# Patient Record
Sex: Female | Born: 1955
Health system: Southern US, Community
[De-identification: ages and names within clinical notes are randomized; demographics above are authoritative.]

## PROBLEM LIST (undated history)

## (undated) DIAGNOSIS — D72819 Decreased white blood cell count, unspecified: Secondary | ICD-10-CM

## (undated) DIAGNOSIS — T7840XA Allergy, unspecified, initial encounter: Secondary | ICD-10-CM

## (undated) DIAGNOSIS — B269 Mumps without complication: Secondary | ICD-10-CM

## (undated) DIAGNOSIS — T783XXA Angioneurotic edema, initial encounter: Secondary | ICD-10-CM

## (undated) DIAGNOSIS — B019 Varicella without complication: Secondary | ICD-10-CM

## (undated) DIAGNOSIS — B059 Measles without complication: Secondary | ICD-10-CM

## (undated) DIAGNOSIS — E785 Hyperlipidemia, unspecified: Secondary | ICD-10-CM

## (undated) DIAGNOSIS — E663 Overweight: Secondary | ICD-10-CM

## (undated) DIAGNOSIS — R040 Epistaxis: Secondary | ICD-10-CM

## (undated) DIAGNOSIS — I10 Essential (primary) hypertension: Secondary | ICD-10-CM

## (undated) HISTORY — DX: Varicella without complication: B01.9

## (undated) HISTORY — DX: Measles without complication: B05.9

## (undated) HISTORY — DX: Mumps without complication: B26.9

## (undated) HISTORY — DX: Angioneurotic edema, initial encounter: T78.3XXA

## (undated) HISTORY — DX: Decreased white blood cell count, unspecified: D72.819

## (undated) HISTORY — DX: Allergy, unspecified, initial encounter: T78.40XA

## (undated) HISTORY — DX: Overweight: E66.3

## (undated) HISTORY — DX: Hyperlipidemia, unspecified: E78.5

## (undated) HISTORY — DX: Epistaxis: R04.0

## (undated) HISTORY — DX: Essential (primary) hypertension: I10

---

## 1998-12-27 ENCOUNTER — Other Ambulatory Visit: Admission: RE | Admit: 1998-12-27 | Discharge: 1998-12-27 | Payer: Self-pay | Admitting: Obstetrics & Gynecology

## 2000-02-07 ENCOUNTER — Other Ambulatory Visit: Admission: RE | Admit: 2000-02-07 | Discharge: 2000-02-07 | Payer: Self-pay | Admitting: Obstetrics & Gynecology

## 2001-05-15 ENCOUNTER — Other Ambulatory Visit: Admission: RE | Admit: 2001-05-15 | Discharge: 2001-05-15 | Payer: Self-pay | Admitting: Obstetrics & Gynecology

## 2002-05-29 ENCOUNTER — Other Ambulatory Visit: Admission: RE | Admit: 2002-05-29 | Discharge: 2002-05-29 | Payer: Self-pay | Admitting: Obstetrics & Gynecology

## 2003-06-03 ENCOUNTER — Other Ambulatory Visit: Admission: RE | Admit: 2003-06-03 | Discharge: 2003-06-03 | Payer: Self-pay | Admitting: Obstetrics & Gynecology

## 2004-06-30 ENCOUNTER — Other Ambulatory Visit: Admission: RE | Admit: 2004-06-30 | Discharge: 2004-06-30 | Payer: Self-pay | Admitting: Obstetrics & Gynecology

## 2005-05-09 ENCOUNTER — Ambulatory Visit: Payer: Self-pay | Admitting: Internal Medicine

## 2005-05-18 ENCOUNTER — Ambulatory Visit: Payer: Self-pay | Admitting: Internal Medicine

## 2005-07-31 ENCOUNTER — Other Ambulatory Visit: Admission: RE | Admit: 2005-07-31 | Discharge: 2005-07-31 | Payer: Self-pay | Admitting: Obstetrics and Gynecology

## 2005-11-21 ENCOUNTER — Ambulatory Visit: Payer: Self-pay | Admitting: Internal Medicine

## 2006-07-31 ENCOUNTER — Ambulatory Visit: Payer: Self-pay | Admitting: Internal Medicine

## 2006-07-31 LAB — CONVERTED CEMR LAB
Alkaline Phosphatase: 48 units/L (ref 39–117)
BUN: 8 mg/dL (ref 6–23)
Basophils Relative: 0.8 % (ref 0.0–1.0)
Bilirubin, Direct: 0.2 mg/dL (ref 0.0–0.3)
CO2: 26 meq/L (ref 19–32)
Cholesterol: 179 mg/dL (ref 0–200)
Eosinophils Absolute: 0.2 10*3/uL (ref 0.0–0.6)
GFR calc Af Amer: 76 mL/min
GFR calc non Af Amer: 63 mL/min
HDL: 60.7 mg/dL (ref >39.0)
Hemoglobin: 14.4 g/dL (ref 12.0–15.0)
Lymphocytes Relative: 30.5 % (ref 12.0–46.0)
MCHC: 34.4 g/dL (ref 30.0–36.0)
MCV: 85.9 fL (ref 78.0–100.0)
Monocytes Absolute: 0.4 10*3/uL (ref 0.2–0.7)
Monocytes Relative: 7.3 % (ref 3.0–11.0)
Neutro Abs: 3.4 10*3/uL (ref 1.4–7.7)
Platelets: 306 10*3/uL (ref 150–400)
Potassium: 4.2 meq/L (ref 3.5–5.1)
Total Protein: 7 g/dL (ref 6.0–8.3)
Triglycerides: 66 mg/dL (ref 0–149)
VLDL: 13 mg/dL (ref 0–40)

## 2006-08-21 ENCOUNTER — Ambulatory Visit: Payer: Self-pay | Admitting: Internal Medicine

## 2006-09-10 ENCOUNTER — Encounter: Payer: Self-pay | Admitting: Internal Medicine

## 2006-09-10 LAB — CONVERTED CEMR LAB

## 2006-12-24 ENCOUNTER — Ambulatory Visit: Payer: Self-pay | Admitting: Internal Medicine

## 2007-01-22 ENCOUNTER — Ambulatory Visit: Payer: Self-pay | Admitting: Internal Medicine

## 2007-03-05 ENCOUNTER — Ambulatory Visit: Payer: Self-pay | Admitting: Internal Medicine

## 2007-04-17 ENCOUNTER — Encounter: Payer: Self-pay | Admitting: Internal Medicine

## 2007-04-17 ENCOUNTER — Ambulatory Visit: Payer: Self-pay

## 2007-04-18 ENCOUNTER — Ambulatory Visit: Payer: Self-pay | Admitting: Internal Medicine

## 2007-04-30 ENCOUNTER — Encounter: Payer: Self-pay | Admitting: Internal Medicine

## 2007-04-30 DIAGNOSIS — I10 Essential (primary) hypertension: Secondary | ICD-10-CM | POA: Insufficient documentation

## 2007-04-30 DIAGNOSIS — T783XXA Angioneurotic edema, initial encounter: Secondary | ICD-10-CM | POA: Insufficient documentation

## 2007-05-03 ENCOUNTER — Encounter: Payer: Self-pay | Admitting: Internal Medicine

## 2007-08-15 ENCOUNTER — Ambulatory Visit: Payer: Self-pay | Admitting: Internal Medicine

## 2007-08-15 DIAGNOSIS — E663 Overweight: Secondary | ICD-10-CM

## 2007-08-15 HISTORY — DX: Overweight: E66.3

## 2007-10-21 ENCOUNTER — Encounter: Payer: Self-pay | Admitting: Internal Medicine

## 2007-10-22 ENCOUNTER — Telehealth: Payer: Self-pay | Admitting: Internal Medicine

## 2007-11-27 ENCOUNTER — Telehealth: Payer: Self-pay | Admitting: Internal Medicine

## 2007-12-03 ENCOUNTER — Encounter: Payer: Self-pay | Admitting: Internal Medicine

## 2007-12-16 LAB — CONVERTED CEMR LAB: Pap Smear: NORMAL

## 2008-02-21 ENCOUNTER — Encounter: Payer: Self-pay | Admitting: Internal Medicine

## 2008-04-24 ENCOUNTER — Telehealth: Payer: Self-pay | Admitting: Internal Medicine

## 2008-04-28 ENCOUNTER — Ambulatory Visit: Payer: Self-pay | Admitting: Internal Medicine

## 2008-05-05 ENCOUNTER — Ambulatory Visit: Payer: Self-pay | Admitting: Internal Medicine

## 2008-05-05 LAB — CONVERTED CEMR LAB
BUN: 9 mg/dL (ref 6–23)
Chloride: 109 meq/L (ref 96–112)
Cholesterol: 178 mg/dL (ref 0–200)
Glucose, Bld: 102 mg/dL — ABNORMAL HIGH (ref 70–99)
Potassium: 3.8 meq/L (ref 3.5–5.1)
TSH: 3.26 microintl units/mL (ref 0.35–5.50)
Triglycerides: 62 mg/dL (ref 0–149)
VLDL: 12 mg/dL (ref 0–40)

## 2008-07-21 ENCOUNTER — Telehealth: Payer: Self-pay | Admitting: Internal Medicine

## 2008-10-27 ENCOUNTER — Ambulatory Visit: Payer: Self-pay | Admitting: Internal Medicine

## 2009-02-02 LAB — CONVERTED CEMR LAB: Pap Smear: NORMAL

## 2009-02-02 LAB — HM MAMMOGRAPHY: HM Mammogram: NORMAL

## 2009-03-08 ENCOUNTER — Telehealth: Payer: Self-pay | Admitting: Internal Medicine

## 2009-04-19 ENCOUNTER — Ambulatory Visit: Payer: Self-pay | Admitting: Internal Medicine

## 2009-05-06 ENCOUNTER — Ambulatory Visit: Payer: Self-pay | Admitting: Internal Medicine

## 2009-05-06 LAB — HM COLONOSCOPY

## 2009-05-08 ENCOUNTER — Encounter: Payer: Self-pay | Admitting: Internal Medicine

## 2009-08-24 ENCOUNTER — Ambulatory Visit: Payer: Self-pay | Admitting: Internal Medicine

## 2009-08-24 DIAGNOSIS — R000 Tachycardia, unspecified: Secondary | ICD-10-CM | POA: Insufficient documentation

## 2009-08-27 ENCOUNTER — Ambulatory Visit: Payer: Self-pay | Admitting: Internal Medicine

## 2009-08-27 LAB — CONVERTED CEMR LAB
ALT: 14 units/L (ref 0–35)
Albumin: 3.6 g/dL (ref 3.5–5.2)
Basophils Relative: 4.5 % — ABNORMAL HIGH (ref 0.0–3.0)
Bilirubin, Direct: 0.2 mg/dL (ref 0.0–0.3)
Cholesterol: 165 mg/dL (ref 0–200)
Eosinophils Absolute: 0.1 10*3/uL (ref 0.0–0.7)
GFR calc non Af Amer: 96.71 mL/min (ref >60)
HDL: 63.4 mg/dL (ref >39.00)
Lymphocytes Relative: 37.5 % (ref 12.0–46.0)
MCHC: 32.9 g/dL (ref 30.0–36.0)
Neutrophils Relative %: 45.2 % (ref 43.0–77.0)
Potassium: 3.8 meq/L (ref 3.5–5.1)
RBC: 4.61 M/uL (ref 3.87–5.11)
Sodium: 141 meq/L (ref 135–145)
Total Protein: 7.3 g/dL (ref 6.0–8.3)
Triglycerides: 51 mg/dL (ref 0.0–149.0)
VLDL: 10.2 mg/dL (ref 0.0–40.0)
WBC: 3.9 10*3/uL — ABNORMAL LOW (ref 4.5–10.5)

## 2009-11-19 ENCOUNTER — Ambulatory Visit: Payer: Self-pay | Admitting: Internal Medicine

## 2009-12-09 ENCOUNTER — Telehealth: Payer: Self-pay | Admitting: Internal Medicine

## 2009-12-16 ENCOUNTER — Ambulatory Visit: Payer: Self-pay | Admitting: Internal Medicine

## 2010-06-02 LAB — HM MAMMOGRAPHY: HM Mammogram: NORMAL

## 2010-07-21 NOTE — Progress Notes (Signed)
Summary: Amlodipine Refill  Phone Note Refill Request Message from:  Fax from Pharmacy on December 09, 2009 8:58 AM  Refills Requested: Medication #1:  AMLODIPINE BESYLATE 10 MG  TABS one by mouth once daily.   Dosage confirmed as above?Dosage Confirmed   Brand Name Necessary? No   Supply Requested: 3 months   Last Refilled: 09/01/2009  Method Requested: Electronic Next Appointment Scheduled: None Initial call taken by: Glendell Docker CMA,  December 09, 2009 8:58 AM    Prescriptions: AMLODIPINE BESYLATE 10 MG  TABS (AMLODIPINE BESYLATE) one by mouth once daily  #90 x 0   Entered by:   Glendell Docker CMA   Authorized by:   D. Thomos Lemons DO   Signed by:   Glendell Docker CMA on 12/09/2009   Method used:   [facility]        [facility] [address] [phone]* (retail)       [address]       [place], [place]  [address]       Ph: [phone] or [phone]       Fax: [phone]   RxID:   [id number]

## 2010-07-21 NOTE — Letter (Signed)
   [place] [facility] [address] [place], [place]  [address]  [place] Phone: [phone]      August 27, 2009   [facility] [place] [address] [place], [place] [address]  RE:  LAB RESULTS  Dear  Ms. Morton,  The following is an interpretation of your most recent lab tests.  Please take note of any instructions provided or changes to medications that have resulted from your lab work.  ELECTROLYTES:  Good - no changes needed  KIDNEY FUNCTION TESTS:  Good - no changes needed  LIVER FUNCTION TESTS:  Good - no changes needed  LIPID PANEL:  Good - no changes needed Triglyceride: 51.0   Cholesterol: 165   LDL: 91   HDL: 63.40   Chol/HDL%:  3  THYROID STUDIES:  Thyroid studies normal TSH: 3.06     CBC:  Good - no changes needed       Sincerely Yours,    Dr. Thomos Lemons

## 2010-07-21 NOTE — Assessment & Plan Note (Signed)
Summary: f/u on meds - jr   Vital Signs:  Patient profile:   55 year old female Height:      68 inches Weight:      199 pounds BMI:     30.37 O2 Sat:      100 % on Room air Temp:     97.8 degrees F oral Pulse rate:   100 / minute Pulse rhythm:   regular Resp:     16 per minute BP sitting:   130 / 80  (right arm) Cuff size:   large  Vitals Entered By: Glendell Docker CMA (August 24, 2009 3:29 PM)  O2 Flow:  Room air CC: Rm 2- Follow up  Comments Medication refills   Primary Care Provider:  Dondra Spry DO  CC:  Rm 2- Follow up .  History of Present Illness:  Hypertension Follow-Up      This is a 55 year old woman who presents for Hypertension follow-up.  The patient reports urinary frequency, but denies lightheadedness.  The patient denies the following associated symptoms: chest pain.  Compliance with medications (by patient report) has been near 100%.  The patient reports that dietary compliance has been fair.  The patient reports no exercise.    Allergies (verified): No Known Drug Allergies  Past History:  Past Medical History: Hypertension Family history of diabetes  ACE - angioedema    Family History: Family History Diabetes 1st degree relative - mother age 21 Father deceased at age 85 - pancreatic cancer Multiple siblings with hypertension    Social History: Occupation:  Marine scientist No children Married Never Smoked  Alcohol use-no   Review of Systems       The patient complains of weight gain.    Physical Exam  General:  alert, well-developed, and well-nourished.   Neck:  supple and no carotid bruits.   Lungs:  normal respiratory effort and normal breath sounds.   Heart:  normal rate, regular rhythm, and no gallop.   Extremities:  No lower extremity edema    Impression & Recommendations:  Problem # 1:  HYPERTENSION (ICD-401.9) Pt ran out of HCTZ.   she complains freq urination.  HR is high.  Increase Coreg CR to 40 mg.  DC  Hctz The following medications were removed from the medication list:    Hydrochlorothiazide 25 Mg Tabs (Hydrochlorothiazide) .Marland Kitchen... Take 1 tablet by mouth once a day (needs ov for more refills) Her updated medication list for this problem includes:    Coreg Cr 40 Mg Xr24h-cap (Carvedilol phosphate) ..... One by mouth once daily    Amlodipine Besylate 10 Mg Tabs (Amlodipine besylate) ..... One by mouth once daily  Complete Medication List: 1)  Microgestin 1/20 1-20 Mg-mcg Tabs (Norethindrone acet-ethinyl est) 2)  Coreg Cr 40 Mg Xr24h-cap (Carvedilol phosphate) .... One by mouth once daily 3)  Amlodipine Besylate 10 Mg Tabs (Amlodipine besylate) .... One by mouth once daily  Patient Instructions: 1)  Please schedule a follow-up appointment in 2 months Prescriptions: COREG CR 40 MG XR24H-CAP (CARVEDILOL PHOSPHATE) one by mouth once daily  #30 x 2   Entered and Authorized by:   D. Thomos Lemons DO   Signed by:   D. Thomos Lemons DO on 08/24/2009   Method used:   [facility]        [facility] [address] [phone]* (retail)       [address]       [place]  [place], [place]  [address]       Ph: [phone] or [phone]       Fax: [phone]   RxID:   [id number]   Current Allergies (reviewed today): No known allergies    Immunization History:  Tetanus/Td Immunization History:    Tetanus/Td:  declined (08/24/2009)  Influenza Immunization History:    Influenza:  declined (08/24/2009)    Preventive Care Screening  Last Tetanus Booster:    Date:  08/24/2009    Results:  Declined  Mammogram:    Date:  02/02/2009    Results:  normal   Pap Smear:    Date:  02/02/2009    Results:  normal

## 2010-07-21 NOTE — Assessment & Plan Note (Signed)
Summary: 3 month follow up/mhf   Vital Signs:  Patient profile:   55 year old female Height:      68 inches Weight:      195 pounds BMI:     29.76 O2 Sat:      98 % on Room air Temp:     98.7 degrees F oral Pulse rate:   96 / minute Pulse rhythm:   regular Resp:     18 per minute BP sitting:   120 / 70  (right arm) Cuff size:   large  Vitals Entered By: Glendell Docker CMA (December 16, 2009 3:33 PM)  O2 Flow:  Room air CC: Rm 2- 3 month follow up Is Patient Diabetic? No Comments medications reviewed, no concerns, refill on Coreg   Primary Care Provider:  DThomos Lemons DO  CC:  Rm 2- 3 month follow up.  History of Present Illness:  Hypertension Follow-Up      This is a 55 year old woman who presents for Hypertension follow-up.  The patient denies headaches and edema.  The patient denies the following associated symptoms: chest pain.  Compliance with medications (by patient report) has been near 100%.  The patient reports that dietary compliance has been fair.    Preventive Screening-Counseling & Management  Alcohol-Tobacco     Smoking Status: never  Allergies (verified): No Known Drug Allergies  Past History:  Past Medical History: Hypertension Family history of diabetes   ACE - angioedema    Family History: Family History Diabetes 1st degree relative - mother age 35 mother passed away in 10/12/22 Father deceased at age 29 - pancreatic cancer Multiple siblings with hypertension    Physical Exam  General:  alert, well-developed, and well-nourished.   Neck:  supple and no carotid bruits.   Lungs:  normal respiratory effort and normal breath sounds.   Heart:  normal rate, regular rhythm, and no gallop.   Extremities:  No lower extremity edema    Impression & Recommendations:  Problem # 1:  HYPERTENSION (ICD-401.9) Assessment Improved well controlled.  Maintain current medication regimen.  Her updated medication list for this problem includes:    Coreg Cr  40 Mg Xr24h-cap (Carvedilol phosphate) .Marland Kitchen... Take 1 tablet by mouth once a day    Amlodipine Besylate 10 Mg Tabs (Amlodipine besylate) ..... One by mouth once daily  BP today: 120/70 Prior BP: 130/80 (08/24/2009)  Labs Reviewed: K+: 3.8 (08/27/2009) Creat: : 0.8 (08/27/2009)   Chol: 165 (08/27/2009)   HDL: 63.40 (08/27/2009)   LDL: 91 (08/27/2009)   TG: 51.0 (08/27/2009)  Complete Medication List: 1)  Microgestin 1/20 1-20 Mg-mcg Tabs (Norethindrone acet-ethinyl est) 2)  Coreg Cr 40 Mg Xr24h-cap (Carvedilol phosphate) .... Take 1 tablet by mouth once a day 3)  Amlodipine Besylate 10 Mg Tabs (Amlodipine besylate) .... One by mouth once daily  Patient Instructions: 1)  Please schedule a follow-up appointment in 1 year for CPX 2)  BMP prior to visit, ICD-9:  401.9 3)  TSH prior to visit, ICD-9:  401.9 4)  Please return for lab work one (1) week before your next appointment.  Prescriptions: AMLODIPINE BESYLATE 10 MG  TABS (AMLODIPINE BESYLATE) one by mouth once daily  #90 x 3   Entered and Authorized by:   D. Thomos Lemons DO   Signed by:   D. Thomos Lemons DO on 12/16/2009   Method used:   [facility]        [facility] [address] #  [id number]* (retail)       [address]       [place], [place]  [address]       Ph: [phone] or [phone]       Fax: [phone]   RxID:   [id number] COREG CR 40 MG XR24H-CAP (CARVEDILOL PHOSPHATE) Take 1 tablet by mouth once a day  #90 x 3   Entered and Authorized by:   D. Thomos Lemons DO   Signed by:   D. Thomos Lemons DO on 12/16/2009   Method used:   [facility]        [facility] [address] [phone]* (retail)       [address]       [place], [place]  [address]       Ph: [phone] or [phone]       Fax: [phone]   RxID:   [id number]   Current Allergies (reviewed today): No known allergies

## 2010-11-04 NOTE — Assessment & Plan Note (Signed)
[facility]                           PRIMARY CARE OFFICE NOTE   NAME:Grassi, Amber [name]                     MRN:          [id number]  DATE:10/22/2006                            DOB:          23-Aug-1955    CHIEF COMPLAINT:  New patient to practice, transferring from [facility], Dr. Fabian [name].   HISTORY OF PRESENT ILLNESS:  Patient is a 55 year old African American  female here to establish continuity of care.  She was formerly followed  by Dr. Fabian [name] for hypertension.  She states she has been treated for the  last 10 to 15 years.  She was on a combination of an ACE inhibitor and  hydrochlorothiazide.  Over several years, she had issues with  intermittent swelling of her lips.  Recently, Accupril was changed to  quinapril, which worsened possible angioedema.  She was instructed to  completely discontinue all ACE inhibitor, and recently started on  amlodipine 5 mg over the last 3 weeks.  She has tolerated this well,  however, her blood pressures have been somewhat labile.  Her blood  pressure today in the office is in the 150s to 160s systolic.  She  reports blood pressure readings in the 120s to 130s when she was seen by  her gynecologist.   CURRENT MEDICATIONS:  1. Hydrochlorothiazide once a day.  2. Amlodipine 5 mg once a day.  3. Microgestin flatulence control pill 1/20 as directed.   ALLERGIES:  ACE INHIBITORS WHICH CAUSE ANGIOEDEMA.   SOCIAL HISTORY:  Patient has been married for the last 27 years.  She  does not have any children.  She works as an Marine scientist.   FAMILY HISTORY:  Mother is age 14, has type 2 diabetes, and is on  insulin.  Father deceased at age 63 of complications of pancreatic  cancer.  Patient has multiple siblings, all of whom have issues with  hypertension.   HABITS:  No alcohol.  No tobacco.   REVIEW OF SYSTEMS:  Denies any history of coronary artery disease,  stroke.  Her blood pressures have been  normal in the past.  She denies  any chest pain or dyspnea.  She does not exercise on a regular basis.  Denies heartburn, nausea, vomiting, constipation, or diarrhea.  She is  reported to have sickle cell trait.   PHYSICAL EXAMINATION:  VITAL SIGNS:  Height is 5 feet 8 inches.  Weight  is 180 pounds.  Temperature is 97.7.  [name] is 118.  Blood pressure is  161/95 on the left in a seated position.  GENERAL:  The patient is a pleasant, somewhat overweight 55 year old  African American female in no apparent distress.  HEENT:  Normocephalic and atraumatic.  Pupils are equal and reactive to  light bilaterally.  Extraocular mobility was intact.  Patient was  anicteric.  Conjunctivae was within normal limits.  External auditory canals and tympanic membranes are clear bilaterally.  Hearing was grossly normal.  Oropharyngeal exam was unremarkable.  NECK:  Supple without any evidence of adenopathy, carotid bruits, or  thyromegaly.  There was no acanthosis nigricans noted.  CHEST  EXAM:  Normal respiratory effort.  Chest was clear to auscultation  bilaterally.  No rhonchi, rales, or wheezing.  CARDIOVASCULAR:  Regular rate and rhythm.  No significant murmurs, rubs,  or gallops appreciated.  ABDOMEN:  Soft and nontender.  Positive bowel sounds.  No organomegaly.  MUSCULOSKELETAL EXAM:  No clubbing, cyanosis, or edema.  Patient had intact dorsalis pedis pulses.  NEUROLOGIC:  Cranial nerves 2 through 12 grossly intact.  She was  nonfocal.   ASSESSMENT:  1. Hypertension.  Suboptimally controlled.  2. History of angioedema secondary to ACE inhibitor.  3. Family history of type 2 diabetes with mild obesity.  4. Health maintenance.   RECOMMENDATIONS:  1. We will add carvedilol 3.125 mg p.o. b.i.d. to her current regimen.      She is to maintain blood pressure log until followup visit.  2. We reviewed her previous labs from February of 2008, and she has a      normal creatinine of 1, normal blood  sugar of 82.  I encouraged      further wt loss.  3. She has a gynecologist, and she obtains periodic Pap, pelvic, and      mammogram.  She is to continue the same.  We discussed the need for      screening colonoscopy once she turns 51.  4. Follow up in 6 weeks.     Barbette Hair. Artist Pais, DO  Electronically Signed    RDY/MedQ  DD: 01/22/2007  DT: 01/23/2007  Job #: [id number]

## 2010-12-13 ENCOUNTER — Ambulatory Visit: Payer: Self-pay | Admitting: Internal Medicine

## 2010-12-23 ENCOUNTER — Ambulatory Visit: Payer: Self-pay | Admitting: Internal Medicine

## 2010-12-28 ENCOUNTER — Telehealth: Payer: Self-pay | Admitting: Internal Medicine

## 2010-12-28 MED ORDER — CARVEDILOL PHOSPHATE ER 40 MG PO CP24: 40.0000 mg | ORAL_CAPSULE | Freq: Every day | ORAL | Status: DC

## 2010-12-28 NOTE — Telephone Encounter (Signed)
Addended by: Mervin Kung A on: 12/28/2010 02:51 PM   Modules accepted: Orders

## 2010-12-28 NOTE — Telephone Encounter (Signed)
Refill- coreg cr 40mg  capsule. Take one capsule every day. Qty 90. Last fill 4.16.12

## 2010-12-28 NOTE — Telephone Encounter (Signed)
Previous refill went to printer. Re-sent rx electronically.

## 2011-01-11 ENCOUNTER — Encounter: Payer: Self-pay | Admitting: Internal Medicine

## 2011-01-19 ENCOUNTER — Encounter: Payer: Self-pay | Admitting: Internal Medicine

## 2011-01-19 ENCOUNTER — Ambulatory Visit (INDEPENDENT_AMBULATORY_CARE_PROVIDER_SITE_OTHER): Payer: Commercial Managed Care - PPO | Admitting: Internal Medicine

## 2011-01-19 ENCOUNTER — Ambulatory Visit: Payer: Self-pay | Admitting: Internal Medicine

## 2011-01-19 DIAGNOSIS — I10 Essential (primary) hypertension: Secondary | ICD-10-CM

## 2011-01-19 DIAGNOSIS — R635 Abnormal weight gain: Secondary | ICD-10-CM

## 2011-01-19 MED ORDER — AMLODIPINE BESYLATE 10 MG PO TABS: 10.0000 mg | ORAL_TABLET | Freq: Every day | ORAL | Status: DC

## 2011-01-19 MED ORDER — CARVEDILOL PHOSPHATE ER 40 MG PO CP24: 40.0000 mg | ORAL_CAPSULE | Freq: Every day | ORAL | Status: DC

## 2011-01-19 NOTE — Progress Notes (Signed)
  Subjective:    Patient ID: Amber [name], female    DOB: 1955-11-08, 55 y.o.   MRN: [id number]  HPI Pt presents to clinic for followup of HTN and evaluation of arm pain. Notes 41month h/o left lateral elbow pain without injury or trauma. Worse with movement. Pain just recently resolved without medication. BP reviewed as nl. Tolerates norvasc without le edema. Sees gyn for pap smears reportedly nl. Wants to lose weight without medication. No other complaints.  Reviewed pmh, medications and allergies    Review of Systems see hpi     Objective:   Physical Exam  Nursing note and vitals reviewed. Constitutional: She appears well-developed and well-nourished.  HENT:  Head: Normocephalic and atraumatic.  Right Ear: External ear normal.  Left Ear: External ear normal.  Eyes: Conjunctivae are normal.  Neck: Neck supple.  Cardiovascular: Normal rate, regular rhythm and normal heart sounds.  Exam reveals no gallop and no friction rub.   No murmur heard. Pulmonary/Chest: Effort normal and breath sounds normal. No respiratory distress. She has no wheezes. She has no rales.  Neurological: She is alert.  Skin: Skin is warm and dry.  Psychiatric: She has a normal mood and affect.          Assessment & Plan:

## 2011-01-19 NOTE — Assessment & Plan Note (Signed)
Discussed decrease in portions, calories, carbs and sugars. Recommend regular aerobic exercise at least 4+ times a week.

## 2011-01-19 NOTE — Assessment & Plan Note (Signed)
Normotensive and stable. Continue current regimen. Attempt regular aerobic exercise and wt loss

## 2011-02-27 ENCOUNTER — Telehealth: Payer: Self-pay | Admitting: Internal Medicine

## 2011-02-27 MED ORDER — AMLODIPINE BESYLATE 10 MG PO TABS: 10.0000 mg | ORAL_TABLET | Freq: Every day | ORAL | Status: DC

## 2011-02-27 NOTE — Telephone Encounter (Signed)
Refill amlodipine besylate 10 mg tab qty 90 take 1 tablet every day last fill 11-28-10

## 2011-02-27 NOTE — Telephone Encounter (Signed)
Rx refill sent to pharmacy. 

## 2011-07-05 ENCOUNTER — Telehealth: Payer: Self-pay | Admitting: Internal Medicine

## 2011-07-05 NOTE — Telephone Encounter (Signed)
Patient is requesting samples of coreg

## 2011-07-06 NOTE — Telephone Encounter (Signed)
Call placed to patient at [phone], no answer. A voice message was left informing patient samples of Coreg not available. She was advised to call back if Rx was needed.

## 2011-08-04 ENCOUNTER — Ambulatory Visit: Payer: Commercial Managed Care - PPO | Admitting: Internal Medicine

## 2011-08-07 ENCOUNTER — Ambulatory Visit (INDEPENDENT_AMBULATORY_CARE_PROVIDER_SITE_OTHER): Payer: Managed Care, Other (non HMO) | Admitting: Internal Medicine

## 2011-08-07 ENCOUNTER — Encounter: Payer: Self-pay | Admitting: Internal Medicine

## 2011-08-07 DIAGNOSIS — I10 Essential (primary) hypertension: Secondary | ICD-10-CM

## 2011-08-07 MED ORDER — CARVEDILOL 6.25 MG PO TABS: 6.2500 mg | ORAL_TABLET | Freq: Two times a day (BID) | ORAL | Status: DC

## 2011-08-07 NOTE — Progress Notes (Signed)
  Subjective:    Patient ID: Amber [name], female    DOB: May 15, 1956, 56 y.o.   MRN: [id number]  HPI Pt presents to clinic for followup of multiple medical problems. BP reviewed under excellent control but notes coreg cr is expensive. Recalls recent bp of 100/77 outside of clinic. Compliant with medication without adverse effect. Denies lower extremity swelling.   Past Medical History  Diagnosis Date  . Hypertension   . Diabetes mellitus   . Angioedema    No past surgical history on file.  reports that she has never smoked. She has never used smokeless tobacco. She reports that she does not drink alcohol. Her drug history not on file. family history includes Cancer in her father; Diabetes (age of onset:78) in her mother; and Hypertension in her other. No Known Allergies    Review of Systems see hpi     Objective:   Physical Exam  Physical Exam  Nursing note and vitals reviewed. Constitutional: Appears well-developed and well-nourished. No distress.  HENT:  Head: Normocephalic and atraumatic.  Right Ear: External ear normal.  Left Ear: External ear normal.  Eyes: Conjunctivae are normal. No scleral icterus.  Neck: Neck supple. Carotid bruit is not present.  Cardiovascular: Normal rate, regular rhythm and normal heart sounds.  Exam reveals no gallop and no friction rub.   No murmur heard. Pulmonary/Chest: Effort normal and breath sounds normal. No respiratory distress. He has no wheezes. no rales.  Lymphadenopathy:    He has no cervical adenopathy.  Neurological:Alert.  Skin: Skin is warm and dry. Not diaphoretic.  Psychiatric: Has a normal mood and affect.        Assessment & Plan:

## 2011-08-07 NOTE — Assessment & Plan Note (Signed)
Convert coreg cr to coreg and lower dose slightly due to tight control. Monitor bp as outpt and f/u in clinic as scheduled. Labs with next visit

## 2011-09-11 ENCOUNTER — Telehealth: Payer: Self-pay | Admitting: Internal Medicine

## 2011-09-11 NOTE — Telephone Encounter (Signed)
Call placed to patient at [phone], no answer. A voice message was,left informing patient [facility] pharmacy was not on file. [facility]. She was advised to call back with the location of the pharmacy that she would like medication sent to.

## 2011-09-11 NOTE — Telephone Encounter (Signed)
Patient was told by pharmacy that she had to call for refill on her amlodipine 10mg   , pharmacy  [facility]

## 2011-09-12 MED ORDER — AMLODIPINE BESYLATE 10 MG PO TABS: 10.0000 mg | ORAL_TABLET | Freq: Every day | ORAL | Status: DC

## 2011-09-12 NOTE — Telephone Encounter (Signed)
Patient returned phone call and left voice message requesting refill to [facility] [address]. Rx refill sent to pharmacy.

## 2011-10-05 ENCOUNTER — Encounter: Payer: Self-pay | Admitting: Internal Medicine

## 2011-10-05 ENCOUNTER — Ambulatory Visit (INDEPENDENT_AMBULATORY_CARE_PROVIDER_SITE_OTHER): Payer: Managed Care, Other (non HMO) | Admitting: Internal Medicine

## 2011-10-05 ENCOUNTER — Telehealth: Payer: Self-pay | Admitting: *Deleted

## 2011-10-05 VITALS — BP 116/70 | HR 88 | Temp 97.9°F | Resp 16 | Ht 68.0 in | Wt 201.0 lb

## 2011-10-05 DIAGNOSIS — I10 Essential (primary) hypertension: Secondary | ICD-10-CM

## 2011-10-05 DIAGNOSIS — Z79899 Other long term (current) drug therapy: Secondary | ICD-10-CM

## 2011-10-05 NOTE — Patient Instructions (Signed)
Please schedule fasting labs prior to next visit Cbc, lipid-401.9, chem7-v58.69

## 2011-10-06 NOTE — Telephone Encounter (Signed)
Future lab orders entered for Amber [name] around 03/27/12.

## 2011-10-09 NOTE — Assessment & Plan Note (Signed)
Normotensive and stable. Continue current regimen. Monitor bp as outpt and followup in clinic as scheduled. Continue norvasc and coreg. Obtain cbc, chem7 and lipid prior to next visit.

## 2011-10-09 NOTE — Progress Notes (Signed)
  Subjective:    Patient ID: Amber [name], female    DOB: 1956-01-05, 56 y.o.   MRN: [id number]  HPI Pt presents to clinic for f/u of HTN. Last visit coreg cr changed to coreg due to cost concerns. Tolerating coreg without adverse effect. outpt bp's reviewed and nl. No leg swelling with norvasc. utd with gyn care. No active complaints.  Past Medical History  Diagnosis Date  . Hypertension   . Diabetes mellitus   . Angioedema    No past surgical history on file.  reports that she has never smoked. She has never used smokeless tobacco. She reports that she does not drink alcohol. Her drug history not on file. family history includes Cancer in her father; Diabetes (age of onset:78) in her mother; and Hypertension in her other. No Known Allergies   Review of Systems see hpi     Objective:   Physical Exam  Physical Exam  Nursing note and vitals reviewed. Constitutional: Appears well-developed and well-nourished. No distress.  HENT:  Head: Normocephalic and atraumatic.  Right Ear: External ear normal.  Left Ear: External ear normal.  Eyes: Conjunctivae are normal. No scleral icterus.  Neck: Neck supple. Carotid bruit is not present.  Cardiovascular: Normal rate, regular rhythm and normal heart sounds.  Exam reveals no gallop and no friction rub.   No murmur heard. Pulmonary/Chest: Effort normal and breath sounds normal. No respiratory distress. He has no wheezes. no rales.  Lymphadenopathy:    He has no cervical adenopathy.  Neurological:Alert.  Skin: Skin is warm and dry. Not diaphoretic.  Psychiatric: Has a normal mood and affect.        Assessment & Plan:

## 2011-11-18 LAB — HM MAMMOGRAPHY: HM MAMMO: NORMAL

## 2012-02-02 ENCOUNTER — Encounter: Payer: Self-pay | Admitting: Internal Medicine

## 2012-03-08 ENCOUNTER — Other Ambulatory Visit: Payer: Self-pay | Admitting: Internal Medicine

## 2012-03-11 NOTE — Telephone Encounter (Signed)
Done/SLS 

## 2012-03-25 ENCOUNTER — Encounter: Payer: Self-pay | Admitting: Internal Medicine

## 2012-04-05 ENCOUNTER — Ambulatory Visit: Payer: Managed Care, Other (non HMO) | Admitting: Internal Medicine

## 2012-07-05 ENCOUNTER — Other Ambulatory Visit: Payer: Self-pay | Admitting: Internal Medicine

## 2012-07-08 NOTE — Telephone Encounter (Signed)
30-day refills given-*PATIENT NEEDS OFFICE VISIT PRIOR TO FUTURE REFILLS*/SLS

## 2012-08-05 ENCOUNTER — Other Ambulatory Visit: Payer: Self-pay | Admitting: Family Medicine

## 2012-08-22 ENCOUNTER — Ambulatory Visit (INDEPENDENT_AMBULATORY_CARE_PROVIDER_SITE_OTHER): Payer: Managed Care, Other (non HMO) | Admitting: Family Medicine

## 2012-08-22 ENCOUNTER — Other Ambulatory Visit: Payer: Self-pay | Admitting: Family Medicine

## 2012-08-22 ENCOUNTER — Encounter: Payer: Self-pay | Admitting: Family Medicine

## 2012-08-22 VITALS — BP 138/80 | HR 85 | Temp 98.5°F | Ht 68.0 in | Wt 187.0 lb

## 2012-08-22 DIAGNOSIS — Z0000 Encounter for general adult medical examination without abnormal findings: Secondary | ICD-10-CM

## 2012-08-22 DIAGNOSIS — I10 Essential (primary) hypertension: Secondary | ICD-10-CM

## 2012-08-22 DIAGNOSIS — R000 Tachycardia, unspecified: Secondary | ICD-10-CM

## 2012-08-22 DIAGNOSIS — R635 Abnormal weight gain: Secondary | ICD-10-CM

## 2012-08-22 LAB — CBC
HCT: 40.5 % (ref 36.0–46.0)
Hemoglobin: 14.1 g/dL (ref 12.0–15.0)
MCH: 28.3 pg (ref 26.0–34.0)
MCHC: 34.8 g/dL (ref 30.0–36.0)
MCV: 81.3 fL (ref 78.0–100.0)

## 2012-08-22 MED ORDER — AMLODIPINE BESYLATE 10 MG PO TABS: 10.0000 mg | ORAL_TABLET | Freq: Every day | ORAL | Status: DC

## 2012-08-22 MED ORDER — CARVEDILOL 6.25 MG PO TABS: 6.2500 mg | ORAL_TABLET | Freq: Two times a day (BID) | ORAL | Status: DC

## 2012-08-22 NOTE — Patient Instructions (Addendum)
Labs prior lipid, renal, cbc, tsh, hepatic  Start Krill oil/MegaRed caps daily by Schiff Metamucil daily And Probiotics Digestive Advantage caps daily  Preventive Care for Adults, Female A healthy lifestyle and preventive care can promote health and wellness. Preventive health guidelines for women include the following key practices.  A routine yearly physical is a good way to check with your caregiver about your health and preventive screening. It is a chance to share any concerns and updates on your health, and to receive a thorough exam.  Visit your dentist for a routine exam and preventive care every 6 months. Brush your teeth twice a day and floss once a day. Good oral hygiene prevents tooth decay and gum disease.  The frequency of eye exams is based on your age, health, family medical history, use of contact lenses, and other factors. Follow your caregiver's recommendations for frequency of eye exams.  Eat a healthy diet. Foods like vegetables, fruits, whole grains, low-fat dairy products, and lean protein foods contain the nutrients you need without too many calories. Decrease your intake of foods high in solid fats, added sugars, and salt. Eat the right amount of calories for you.Get information about a proper diet from your caregiver, if necessary.  Regular physical exercise is one of the most important things you can do for your health. Most adults should get at least 150 minutes of moderate-intensity exercise (any activity that increases your heart rate and causes you to sweat) each week. In addition, most adults need muscle-strengthening exercises on 2 or more days a week.  Maintain a healthy weight. The body mass index (BMI) is a screening tool to identify possible weight problems. It provides an estimate of body fat based on height and weight. Your caregiver can help determine your BMI, and can help you achieve or maintain a healthy weight.For adults 20 years and older:  A BMI  below 18.5 is considered underweight.  A BMI of 18.5 to 24.9 is normal.  A BMI of 25 to 29.9 is considered overweight.  A BMI of 30 and above is considered obese.  Maintain normal blood lipids and cholesterol levels by exercising and minimizing your intake of saturated fat. Eat a balanced diet with plenty of fruit and vegetables. Blood tests for lipids and cholesterol should begin at age 61 and be repeated every 5 years. If your lipid or cholesterol levels are high, you are over 50, or you are at high risk for heart disease, you may need your cholesterol levels checked more frequently.Ongoing high lipid and cholesterol levels should be treated with medicines if diet and exercise are not effective.  If you smoke, find out from your caregiver how to quit. If you do not use tobacco, do not start.  If you are pregnant, do not drink alcohol. If you are breastfeeding, be very cautious about drinking alcohol. If you are not pregnant and choose to drink alcohol, do not exceed 1 drink per day. One drink is considered to be 12 ounces (355 mL) of beer, 5 ounces (148 mL) of wine, or 1.5 ounces (44 mL) of liquor.  Avoid use of street drugs. Do not share needles with anyone. Ask for help if you need support or instructions about stopping the use of drugs.  High blood pressure causes heart disease and increases the risk of stroke. Your blood pressure should be checked at least every 1 to 2 years. Ongoing high blood pressure should be treated with medicines if weight loss and exercise are  not effective.  If you are 20 to 56 years old, ask your caregiver if you should take aspirin to prevent strokes.  Diabetes screening involves taking a blood sample to check your fasting blood sugar level. This should be done once every 3 years, after age 91, if you are within normal weight and without risk factors for diabetes. Testing should be considered at a younger age or be carried out more frequently if you are  overweight and have at least 1 risk factor for diabetes.  Breast cancer screening is essential preventive care for women. You should practice "breast self-awareness." This means understanding the normal appearance and feel of your breasts and may include breast self-examination. Any changes detected, no matter how small, should be reported to a caregiver. Women in their 75s and 30s should have a clinical breast exam (CBE) by a caregiver as part of a regular health exam every 1 to 3 years. After age 49, women should have a CBE every year. Starting at age 7, women should consider having a mammography (breast X-ray test) every year. Women who have a family history of breast cancer should talk to their caregiver about genetic screening. Women at a high risk of breast cancer should talk to their caregivers about having magnetic resonance imaging (MRI) and a mammography every year.  The Pap test is a screening test for cervical cancer. A Pap test can show cell changes on the cervix that might become cervical cancer if left untreated. A Pap test is a procedure in which cells are obtained and examined from the lower end of the uterus (cervix).  Women should have a Pap test starting at age 65.  Between ages 55 and 57, Pap tests should be repeated every 2 years.  Beginning at age 26, you should have a Pap test every 3 years as long as the past 3 Pap tests have been normal.  Some women have medical problems that increase the chance of getting cervical cancer. Talk to your caregiver about these problems. It is especially important to talk to your caregiver if a new problem develops soon after your last Pap test. In these cases, your caregiver may recommend more frequent screening and Pap tests.  The above recommendations are the same for women who have or have not gotten the vaccine for human papillomavirus (HPV).  If you had a hysterectomy for a problem that was not cancer or a condition that could lead to  cancer, then you no longer need Pap tests. Even if you no longer need a Pap test, a regular exam is a good idea to make sure no other problems are starting.  If you are between ages 42 and 24, and you have had normal Pap tests going back 10 years, you no longer need Pap tests. Even if you no longer need a Pap test, a regular exam is a good idea to make sure no other problems are starting.  If you have had past treatment for cervical cancer or a condition that could lead to cancer, you need Pap tests and screening for cancer for at least 20 years after your treatment.  If Pap tests have been discontinued, risk factors (such as a new sexual partner) need to be reassessed to determine if screening should be resumed.  The HPV test is an additional test that may be used for cervical cancer screening. The HPV test looks for the virus that can cause the cell changes on the cervix. The cells collected during  the Pap test can be tested for HPV. The HPV test could be used to screen women aged 68 years and older, and should be used in women of any age who have unclear Pap test results. After the age of 66, women should have HPV testing at the same frequency as a Pap test.  Colorectal cancer can be detected and often prevented. Most routine colorectal cancer screening begins at the age of 64 and continues through age 86. However, your caregiver may recommend screening at an earlier age if you have risk factors for colon cancer. On a yearly basis, your caregiver may provide home test kits to check for hidden blood in the stool. Use of a small camera at the end of a tube, to directly examine the colon (sigmoidoscopy or colonoscopy), can detect the earliest forms of colorectal cancer. Talk to your caregiver about this at age 27, when routine screening begins. Direct examination of the colon should be repeated every 5 to 10 years through age 44, unless early forms of pre-cancerous polyps or small growths are  found.  Hepatitis C blood testing is recommended for all people born from [birth year] through 1965 and any individual with known risks for hepatitis C.  Practice safe sex. Use condoms and avoid high-risk sexual practices to reduce the spread of sexually transmitted infections (STIs). STIs include gonorrhea, chlamydia, syphilis, trichomonas, herpes, HPV, and human immunodeficiency virus (HIV). Herpes, HIV, and HPV are viral illnesses that have no cure. They can result in disability, cancer, and death. Sexually active women aged 21 and younger should be checked for chlamydia. Older women with new or multiple partners should also be tested for chlamydia. Testing for other STIs is recommended if you are sexually active and at increased risk.  Osteoporosis is a disease in which the bones lose minerals and strength with aging. This can result in serious bone fractures. The risk of osteoporosis can be identified using a bone density scan. Women ages 34 and over and women at risk for fractures or osteoporosis should discuss screening with their caregivers. Ask your caregiver whether you should take a calcium supplement or vitamin D to reduce the rate of osteoporosis.  Menopause can be associated with physical symptoms and risks. Hormone replacement therapy is available to decrease symptoms and risks. You should talk to your caregiver about whether hormone replacement therapy is right for you.  Use sunscreen with sun protection factor (SPF) of 30 or more. Apply sunscreen liberally and repeatedly throughout the day. You should seek shade when your shadow is shorter than you. Protect yourself by wearing long sleeves, pants, a wide-brimmed hat, and sunglasses year round, whenever you are outdoors.  Once a month, do a whole body skin exam, using a mirror to look at the skin on your back. Notify your caregiver of new moles, moles that have irregular borders, moles that are larger than a pencil eraser, or moles that have  changed in shape or color.  Stay current with required immunizations.  Influenza. You need a dose every fall (or winter). The composition of the flu vaccine changes each year, so being vaccinated once is not enough.  Pneumococcal polysaccharide. You need 1 to 2 doses if you smoke cigarettes or if you have certain chronic medical conditions. You need 1 dose at age 60 (or older) if you have never been vaccinated.  Tetanus, diphtheria, pertussis (Tdap, Td). Get 1 dose of Tdap vaccine if you are younger than age 52, are over 43 and have contact  with an infant, are a Research scientist (physical sciences), are pregnant, or simply want to be protected from whooping cough. After that, you need a Td booster dose every 10 years. Consult your caregiver if you have not had at least 3 tetanus and diphtheria-containing shots sometime in your life or have a deep or dirty wound.  HPV. You need this vaccine if you are a woman age 36 or younger. The vaccine is given in 3 doses over 6 months.  Measles, mumps, rubella (MMR). You need at least 1 dose of MMR if you were born in 1957 or later. You may also need a second dose.  Meningococcal. If you are age 39 to 67 and a first-year college student living in a residence hall, or have one of several medical conditions, you need to get vaccinated against meningococcal disease. You may also need additional booster doses.  Zoster (shingles). If you are age 5 or older, you should get this vaccine.  Varicella (chickenpox). If you have never had chickenpox or you were vaccinated but received only 1 dose, talk to your caregiver to find out if you need this vaccine.  Hepatitis A. You need this vaccine if you have a specific risk factor for hepatitis A virus infection or you simply wish to be protected from this disease. The vaccine is usually given as 2 doses, 6 to 18 months apart.  Hepatitis B. You need this vaccine if you have a specific risk factor for hepatitis B virus infection or you  simply wish to be protected from this disease. The vaccine is given in 3 doses, usually over 6 months. Preventive Services / Frequency Ages 69 to 14  Blood pressure check.** / Every 1 to 2 years.  Lipid and cholesterol check.** / Every 5 years beginning at age 41.  Clinical breast exam.** / Every 3 years for women in their 89s and 30s.  Pap test.** / Every 2 years from ages 31 through 30. Every 3 years starting at age 8 through age 34 or 52 with a history of 3 consecutive normal Pap tests.  HPV screening.** / Every 3 years from ages 66 through ages 59 to 65 with a history of 3 consecutive normal Pap tests.  Hepatitis C blood test.** / For any individual with known risks for hepatitis C.  Skin self-exam. / Monthly.  Influenza immunization.** / Every year.  Pneumococcal polysaccharide immunization.** / 1 to 2 doses if you smoke cigarettes or if you have certain chronic medical conditions.  Tetanus, diphtheria, pertussis (Tdap, Td) immunization. / A one-time dose of Tdap vaccine. After that, you need a Td booster dose every 10 years.  HPV immunization. / 3 doses over 6 months, if you are 8 and younger.  Measles, mumps, rubella (MMR) immunization. / You need at least 1 dose of MMR if you were born in 1957 or later. You may also need a second dose.  Meningococcal immunization. / 1 dose if you are age 32 to 83 and a first-year college student living in a residence hall, or have one of several medical conditions, you need to get vaccinated against meningococcal disease. You may also need additional booster doses.  Varicella immunization.** / Consult your caregiver.  Hepatitis A immunization.** / Consult your caregiver. 2 doses, 6 to 18 months apart.  Hepatitis B immunization.** / Consult your caregiver. 3 doses usually over 6 months. Ages 87 to 29  Blood pressure check.** / Every 1 to 2 years.  Lipid and cholesterol check.** / Every 5  years beginning at age 64.  Clinical breast  exam.** / Every year after age 48.  Mammogram.** / Every year beginning at age 46 and continuing for as long as you are in good health. Consult with your caregiver.  Pap test.** / Every 3 years starting at age 62 through age 21 or 54 with a history of 3 consecutive normal Pap tests.  HPV screening.** / Every 3 years from ages 67 through ages 38 to 77 with a history of 3 consecutive normal Pap tests.  Fecal occult blood test (FOBT) of stool. / Every year beginning at age 81 and continuing until age 39. You may not need to do this test if you get a colonoscopy every 10 years.  Flexible sigmoidoscopy or colonoscopy.** / Every 5 years for a flexible sigmoidoscopy or every 10 years for a colonoscopy beginning at age 81 and continuing until age 56.  Hepatitis C blood test.** / For all people born from [birth year] through 1965 and any individual with known risks for hepatitis C.  Skin self-exam. / Monthly.  Influenza immunization.** / Every year.  Pneumococcal polysaccharide immunization.** / 1 to 2 doses if you smoke cigarettes or if you have certain chronic medical conditions.  Tetanus, diphtheria, pertussis (Tdap, Td) immunization.** / A one-time dose of Tdap vaccine. After that, you need a Td booster dose every 10 years.  Measles, mumps, rubella (MMR) immunization. / You need at least 1 dose of MMR if you were born in 1957 or later. You may also need a second dose.  Varicella immunization.** / Consult your caregiver.  Meningococcal immunization.** / Consult your caregiver.  Hepatitis A immunization.** / Consult your caregiver. 2 doses, 6 to 18 months apart.  Hepatitis B immunization.** / Consult your caregiver. 3 doses, usually over 6 months. Ages 70 and over  Blood pressure check.** / Every 1 to 2 years.  Lipid and cholesterol check.** / Every 5 years beginning at age 39.  Clinical breast exam.** / Every year after age 76.  Mammogram.** / Every year beginning at age 84 and continuing  for as long as you are in good health. Consult with your caregiver.  Pap test.** / Every 3 years starting at age 59 through age 37 or 52 with a 3 consecutive normal Pap tests. Testing can be stopped between 65 and 70 with 3 consecutive normal Pap tests and no abnormal Pap or HPV tests in the past 10 years.  HPV screening.** / Every 3 years from ages 46 through ages 55 or 64 with a history of 3 consecutive normal Pap tests. Testing can be stopped between 65 and 70 with 3 consecutive normal Pap tests and no abnormal Pap or HPV tests in the past 10 years.  Fecal occult blood test (FOBT) of stool. / Every year beginning at age 73 and continuing until age 62. You may not need to do this test if you get a colonoscopy every 10 years.  Flexible sigmoidoscopy or colonoscopy.** / Every 5 years for a flexible sigmoidoscopy or every 10 years for a colonoscopy beginning at age 42 and continuing until age 8.  Hepatitis C blood test.** / For all people born from [birth year] through 1964 and any individual with known risks for hepatitis C.  Osteoporosis screening.** / A one-time screening for women ages 51 and over and women at risk for fractures or osteoporosis.  Skin self-exam. / Monthly.  Influenza immunization.** / Every year.  Pneumococcal polysaccharide immunization.** / 1 dose at age 48 (or older)  if you have never been vaccinated.  Tetanus, diphtheria, pertussis (Tdap, Td) immunization. / A one-time dose of Tdap vaccine if you are over 65 and have contact with an infant, are a Research scientist (physical sciences), or simply want to be protected from whooping cough. After that, you need a Td booster dose every 10 years.  Varicella immunization.** / Consult your caregiver.  Meningococcal immunization.** / Consult your caregiver.  Hepatitis A immunization.** / Consult your caregiver. 2 doses, 6 to 18 months apart.  Hepatitis B immunization.** / Check with your caregiver. 3 doses, usually over 6 months. ** Family history  and personal history of risk and conditions may change your caregiver's recommendations. Document Released: 08/01/2001 Document Revised: 08/28/2011 Document Reviewed: 10/31/2010 [facility] Patient Information 2013 [place], [place].

## 2012-08-23 LAB — LIPID PANEL
Cholesterol: 175 mg/dL (ref 0–200)
Triglycerides: 67 mg/dL (ref <150)
VLDL: 13 mg/dL (ref 0–40)

## 2012-08-23 LAB — BASIC METABOLIC PANEL
BUN: 17 mg/dL (ref 6–23)
Calcium: 10.1 mg/dL (ref 8.4–10.5)
Creat: 1.02 mg/dL (ref 0.50–1.10)
Glucose, Bld: 92 mg/dL (ref 70–99)

## 2012-08-23 LAB — PHOSPHORUS: Phosphorus: 3.5 mg/dL (ref 2.3–4.6)

## 2012-08-23 LAB — HEPATIC FUNCTION PANEL
Alkaline Phosphatase: 85 U/L (ref 39–117)
Bilirubin, Direct: 0.2 mg/dL (ref 0.0–0.3)
Indirect Bilirubin: 0.7 mg/dL (ref 0.0–0.9)

## 2012-08-25 ENCOUNTER — Encounter: Payer: Self-pay | Admitting: Family Medicine

## 2012-08-25 NOTE — Assessment & Plan Note (Signed)
Well controlled today.

## 2012-08-25 NOTE — Assessment & Plan Note (Signed)
Encourage DASH diet and increase exercise.

## 2012-08-25 NOTE — Progress Notes (Signed)
Patient ID: Amber [name], female   DOB: 1956/01/21, 57 y.o.   MRN: [id number] Amber [name] [id number] 1955-10-24 08/25/2012      Progress Note-Follow Up  Subjective  Chief Complaint  Chief Complaint  Patient presents with  . Follow-up    HPI  Female in today for followup. Generally doing well. No recent illness. No fevers or chills. No headache. No GI or GU complaints. No chest pain, palpitations, shortness of breath. Has 4 sisters and 1 brother many with hypertension and doing well otherwise  Past Medical History  Diagnosis Date  . Hypertension   . Angioedema   . Measles   . Mumps   . Chicken pox     History reviewed. No pertinent past surgical history.  Family History  Problem Relation Age of Onset  . Diabetes Mother 70  . Kidney disease Mother   . Heart disease Mother   . Cancer Father     pancreatic  . Hypertension Other     multiple    History   Social History  . Marital Status: Married    Spouse Name: N/A    Number of Children: N/A  . Years of Education: N/A   Occupational History  . Not on file.   Social History Main Topics  . Smoking status: Never Smoker   . Smokeless tobacco: Never Used  . Alcohol Use: No  . Drug Use: Not on file  . Sexually Active: No   Other Topics Concern  . Not on file   Social History Narrative  . No narrative on file    Current Outpatient Prescriptions on File Prior to Visit  Medication Sig Dispense Refill  . Multiple Vitamins-Minerals (MEGA MULTIVITAMIN FOR WOMEN PO) Take 1 tablet by mouth daily.      . Omega-3 Fatty Acids (OMEGA 3 PO) Take 2 capsules by mouth daily.       No current facility-administered medications on file prior to visit.    No Known Allergies  Review of Systems  Review of Systems  Constitutional: Negative for fever and malaise/fatigue.  HENT: Negative for congestion.   Eyes: Negative for discharge.  Respiratory: Negative for shortness of breath.   Cardiovascular: Negative for  chest pain, palpitations and leg swelling.  Gastrointestinal: Negative for nausea, abdominal pain and diarrhea.  Genitourinary: Negative for dysuria.  Musculoskeletal: Negative for falls.  Skin: Negative for rash.  Neurological: Negative for loss of consciousness and headaches.  Endo/Heme/Allergies: Negative for polydipsia.  Psychiatric/Behavioral: Negative for depression and suicidal ideas. The patient is not nervous/anxious and does not have insomnia.     Objective  BP 138/80  Pulse 85  Temp(Src) 98.5 F (36.9 C) (Oral)  Ht 5\' 8"  (1.727 m)  Wt 187 lb 0.6 oz (84.841 kg)  BMI 28.45 kg/m2  SpO2 97%  Physical Exam  Physical Exam  Constitutional: She is oriented to person, place, and time and well-developed, well-nourished, and in no distress. No distress.  HENT:  Head: Normocephalic and atraumatic.  Eyes: Conjunctivae are normal.  Neck: Neck supple. No thyromegaly present.  Cardiovascular: Normal rate, regular rhythm and normal heart sounds.   No murmur heard. Pulmonary/Chest: Effort normal and breath sounds normal. She has no wheezes.  Abdominal: She exhibits no distension and no mass.  Musculoskeletal: She exhibits no edema.  Lymphadenopathy:    She has no cervical adenopathy.  Neurological: She is alert and oriented to person, place, and time.  Skin: Skin is warm and dry. No rash noted. She  is not diaphoretic.  Psychiatric: Memory, affect and judgment normal.    Lab Results  Component Value Date   TSH 2.422 08/22/2012   Lab Results  Component Value Date   WBC 5.1 08/22/2012   HGB 14.1 08/22/2012   HCT 40.5 08/22/2012   MCV 81.3 08/22/2012   PLT 260 08/22/2012   Lab Results  Component Value Date   CREATININE 1.02 08/22/2012   BUN 17 08/22/2012   NA 145 08/22/2012   K 4.3 08/22/2012   CL 108 08/22/2012   CO2 27 08/22/2012   Lab Results  Component Value Date   ALT 10 08/22/2012   AST 19 08/22/2012   ALKPHOS 85 08/22/2012   BILITOT 0.9 08/22/2012   Lab Results  Component Value Date    CHOL 175 08/22/2012   Lab Results  Component Value Date   HDL 70 08/22/2012   Lab Results  Component Value Date   LDLCALC 92 08/22/2012   Lab Results  Component Value Date   TRIG 67 08/22/2012   Lab Results  Component Value Date   CHOLHDL 2.5 08/22/2012     Assessment & Plan  HYPERTENSION Well controlled, no changes, consider DASH diet  TACHYCARDIA Well controlled today  WEIGHT GAIN Encourage DASH diet and increase exercise.

## 2012-08-25 NOTE — Assessment & Plan Note (Signed)
Well controlled, no changes, consider DASH diet 

## 2012-08-28 NOTE — Progress Notes (Signed)
Quick Note:  Patient Informed and voiced understanding ______ 

## 2012-09-03 ENCOUNTER — Telehealth: Payer: Self-pay | Admitting: Family Medicine

## 2012-09-03 NOTE — Telephone Encounter (Signed)
Try Abreva

## 2012-09-03 NOTE — Telephone Encounter (Signed)
Left a detailed message on patients answering machine.

## 2012-09-03 NOTE — Telephone Encounter (Signed)
Caller Name: Deklyn  Phone: [phone]  Patient: Amber [name], Amber [name]  Gender: Female  DOB: 22-Feb-1956  Age: 57 Years  PCP: [facility])  Pregnant: No  Office Follow Up:  Does the office need to follow up with this patient?: Yes  Instructions For The Office: Pharmacy [facility]-[phone] . PATIENT ASKING FOR SCRIPT FOR RELIEF OF FEVER BLISTER. Home care instructions and call back parameters reviewed  RN Note:  Pharmacy [facility]-[phone] . PATIENT ASKING FOR SCRIPT FOR RELIEF OF FEVER BLISTER. Home care instructions and call back parameters reviewed  Symptoms  Reason For Call & Symptoms: Patient requesting medication for fever blister. Onset last night 09/02/12. Located on upper lip on Left side.  Reviewed Health History In EMR: Yes  Reviewed Medications In EMR: Yes  Reviewed Allergies In EMR: Yes  Reviewed Surgeries / Procedures: No  Date of Onset of Symptoms: 09/02/2012  Treatments Tried: Vaseline and salt  Treatments Tried Worked: No  OB / GYN:  LMP: Unknown  Guideline(s) Used:  Cold Sores - Fever Blisters of Lip  Disposition Per Guideline:  Callback by PCP Today  Reason For Disposition Reached:  Herpes sores are a recurrent problem, and caller wants a prescription medicine to take the next time they occur  Advice Given:  General Information - Cold Sores  Fever blisters or cold sores occur on one side of the outer lip.  Typically last 7-10 days.  Treatment with a cold sore cream can reduce the pain and shorten the course by a day or 2.  Docosanol 10% Cream:  Apply over-the-counter docosanol cream (trade name Abreva) to the cold sore 5 times daily until healing occurs.  Begin using this cream as soon as you first sense the beginning of an outbreak.  Read and follow the package instructions. Ask your physician's opinion.  Contagiousness:  Herpes from cold sores is contagious to other people. Discourage picking or rubbing the  sore. Don't open the blisters. Wash your hands frequently. The cold sores are contagious until dry (approximately 5-7 days). Most cold sore sufferers note a tingling in the lip before the sore appears (prodromal phase). Patients are also contagious during this period.  Eyes - Avoid spreading the virus to someone's eye by kissing or touching; an eye infection can be serious (herpes keratitis).  Mouth - Since the blisters and mouth secretions are contagious, avoid kissing other people during this time. Avoid sharing drinking glasses, eating utensils, or razors.  Sex - Avoid oral sex during this time. Herpes from sores on your mouth can spread to your partner's genital area.  Contact with Immunocompromised People - Avoid contact with anyone who has eczema or a weakened immune system.  Prevention:  Since cold sores are often triggered by exposure to intense sunlight, use a lip balm containing a sunscreen (SPF 30 or higher).  Call Back If:  Sores look infected (spreading redness)  Sores occur near or in the eye  Sores last longer than 10 days  You become worse  Patient Will Follow Care Advice:  YES

## 2012-09-03 NOTE — Telephone Encounter (Signed)
Please advise call-a-nurse

## 2012-09-03 NOTE — Telephone Encounter (Signed)
I usually instruct people tostart with OTC Abreva because it is the same antiviral as in the prescription and we have a lot of trouble with insurance and prior auths on the prescription stuff so it can take days to get it sometimes. Sometimes they never approve.

## 2013-01-13 ENCOUNTER — Encounter: Payer: Self-pay | Admitting: Family Medicine

## 2013-01-13 ENCOUNTER — Ambulatory Visit (INDEPENDENT_AMBULATORY_CARE_PROVIDER_SITE_OTHER): Payer: Managed Care, Other (non HMO) | Admitting: Family Medicine

## 2013-01-13 VITALS — BP 101/70 | HR 80 | Temp 98.5°F | Ht 68.0 in | Wt 181.0 lb

## 2013-01-13 DIAGNOSIS — I10 Essential (primary) hypertension: Secondary | ICD-10-CM

## 2013-01-13 DIAGNOSIS — R040 Epistaxis: Secondary | ICD-10-CM

## 2013-01-13 MED ORDER — MUPIROCIN 2 % EX OINT: TOPICAL_OINTMENT | Freq: Every evening | CUTANEOUS | Status: DC | PRN

## 2013-01-13 NOTE — Patient Instructions (Addendum)

## 2013-01-15 ENCOUNTER — Encounter: Payer: Self-pay | Admitting: Family Medicine

## 2013-01-15 ENCOUNTER — Ambulatory Visit: Payer: Managed Care, Other (non HMO) | Admitting: Family Medicine

## 2013-01-15 DIAGNOSIS — R040 Epistaxis: Secondary | ICD-10-CM

## 2013-01-15 HISTORY — DX: Epistaxis: R04.0

## 2013-01-15 NOTE — Assessment & Plan Note (Signed)
Recurrent but infrequent and easy to control, given an rx for Bactroban to use prn and if symptoms worsen she will notify us for referral to ENT

## 2013-01-15 NOTE — Progress Notes (Signed)
Patient ID: Amber [name], female   DOB: 02/01/1956, 57 y.o.   MRN: [id number] SHANTELL BELONGIA [id number] 08/12/1955 01/15/2013      Progress Note-Follow Up  Subjective  Chief Complaint  Chief Complaint  Patient presents with  . Epistaxis    nose bleeds- last friday, in the middle of last week and 1 the week before    HPI  Patient is a 57 year old female who is in today complaining of occasional epistaxis. She has had 2-3 episodes in the last couple of weeks. She has had epistaxis in the past but they're usually not as frequent. They have been easy to control. They occur more in the left than the right. She's had some very mild postnasal drip as well but no other acute symptoms. No headache, fevers, ear pain, sore throat, chest pain, palpitations, shortness of breath, GI or GU concerns. Denies any pruritus or significant allergic symptoms  Past Medical History  Diagnosis Date  . Hypertension   . Angioedema   . Measles   . Mumps   . Chicken pox   . Epistaxis 01/15/2013    History reviewed. No pertinent past surgical history.  Family History  Problem Relation Age of Onset  . Diabetes Mother 52  . Kidney disease Mother   . Heart disease Mother   . Cancer Father     pancreatic  . Hypertension Other     multiple  . Hypertension Brother   . Hypertension Sister     History   Social History  . Marital Status: Married    Spouse Name: N/A    Number of Children: N/A  . Years of Education: N/A   Occupational History  . Not on file.   Social History Main Topics  . Smoking status: Never Smoker   . Smokeless tobacco: Never Used  . Alcohol Use: No  . Drug Use: Not on file  . Sexually Active: No   Other Topics Concern  . Not on file   Social History Narrative  . No narrative on file    Current Outpatient Prescriptions on File Prior to Visit  Medication Sig Dispense Refill  . amLODipine (NORVASC) 10 MG tablet Take 1 tablet (10 mg total) by mouth daily.  90  tablet  3  . carvedilol (COREG) 6.25 MG tablet Take 1 tablet (6.25 mg total) by mouth 2 (two) times daily with a meal.  180 tablet  3  . Multiple Vitamins-Minerals (MEGA MULTIVITAMIN FOR WOMEN PO) Take 1 tablet by mouth daily.      . Omega-3 Fatty Acids (OMEGA 3 PO) Take 2 capsules by mouth daily.       No current facility-administered medications on file prior to visit.    No Known Allergies  Review of Systems  Review of Systems  Constitutional: Negative for fever and malaise/fatigue.  HENT: Negative for congestion.   Eyes: Negative for pain and discharge.  Respiratory: Negative for cough and shortness of breath.   Cardiovascular: Negative for chest pain, palpitations and leg swelling.  Gastrointestinal: Negative for nausea, abdominal pain and diarrhea.  Genitourinary: Negative for dysuria.  Musculoskeletal: Negative for falls.  Skin: Negative for rash.  Neurological: Negative for loss of consciousness and headaches.  Endo/Heme/Allergies: Negative for polydipsia.  Psychiatric/Behavioral: Negative for depression and suicidal ideas. The patient is not nervous/anxious and does not have insomnia.     Objective  BP 101/70  Pulse 80  Temp(Src) 98.5 F (36.9 C) (Oral)  Ht 5\' 8"  (1.727 m)  Wt 181 lb (82.101 kg)  BMI 27.53 kg/m2  SpO2 98%  Physical Exam  Physical Exam  Constitutional: She is oriented to person, place, and time and well-developed, well-nourished, and in no distress. No distress.  HENT:  Head: Normocephalic and atraumatic.  Eyes: Conjunctivae are normal.  Neck: Neck supple. No thyromegaly present.  Cardiovascular: Normal rate and regular rhythm.  Exam reveals no gallop.   No murmur heard. Pulmonary/Chest: Effort normal and breath sounds normal. She has no wheezes.  Abdominal: She exhibits no distension and no mass.  Musculoskeletal: She exhibits no edema.  Lymphadenopathy:    She has no cervical adenopathy.  Neurological: She is alert and oriented to  person, place, and time.  Skin: Skin is warm and dry. No rash noted. She is not diaphoretic.  Psychiatric: Memory, affect and judgment normal.    Lab Results  Component Value Date   TSH 2.422 08/22/2012   Lab Results  Component Value Date   WBC 5.1 08/22/2012   HGB 14.1 08/22/2012   HCT 40.5 08/22/2012   MCV 81.3 08/22/2012   PLT 260 08/22/2012   Lab Results  Component Value Date   CREATININE 1.02 08/22/2012   BUN 17 08/22/2012   NA 145 08/22/2012   K 4.3 08/22/2012   CL 108 08/22/2012   CO2 27 08/22/2012   Lab Results  Component Value Date   ALT 10 08/22/2012   AST 19 08/22/2012   ALKPHOS 85 08/22/2012   BILITOT 0.9 08/22/2012   Lab Results  Component Value Date   CHOL 175 08/22/2012   Lab Results  Component Value Date   HDL 70 08/22/2012   Lab Results  Component Value Date   LDLCALC 92 08/22/2012   Lab Results  Component Value Date   TRIG 67 08/22/2012   Lab Results  Component Value Date   CHOLHDL 2.5 08/22/2012     Assessment & Plan  HYPERTENSION Well controlled today no changes  Epistaxis Recurrent but infrequent and easy to control, given an rx for Bactroban to use prn and if symptoms worsen she will notify us for referral to ENT

## 2013-01-15 NOTE — Assessment & Plan Note (Signed)
Well controlled today no changes 

## 2013-08-21 ENCOUNTER — Encounter: Payer: Managed Care, Other (non HMO) | Admitting: Family Medicine

## 2013-08-30 ENCOUNTER — Other Ambulatory Visit: Payer: Self-pay | Admitting: Family Medicine

## 2013-09-01 ENCOUNTER — Other Ambulatory Visit: Payer: Self-pay | Admitting: Family Medicine

## 2013-09-01 NOTE — Telephone Encounter (Signed)
Please advise pt that a 30 day supply was sent to the pharmacy but an appt needs to be made for additional refills. Thanks

## 2013-09-02 NOTE — Telephone Encounter (Signed)
Left message for patient to return my call.

## 2013-09-03 NOTE — Telephone Encounter (Signed)
Left message for patient to return my call.

## 2013-09-08 NOTE — Telephone Encounter (Signed)
Mailed letter °

## 2013-09-08 NOTE — Telephone Encounter (Signed)
Left message for patient to return my call.

## 2013-09-18 ENCOUNTER — Encounter: Payer: Self-pay | Admitting: Family Medicine

## 2013-09-18 ENCOUNTER — Ambulatory Visit (INDEPENDENT_AMBULATORY_CARE_PROVIDER_SITE_OTHER): Payer: Managed Care, Other (non HMO) | Admitting: Family Medicine

## 2013-09-18 VITALS — BP 118/86 | HR 87 | Temp 98.6°F | Ht 68.0 in | Wt 196.1 lb

## 2013-09-18 DIAGNOSIS — R635 Abnormal weight gain: Secondary | ICD-10-CM

## 2013-09-18 DIAGNOSIS — T7840XA Allergy, unspecified, initial encounter: Secondary | ICD-10-CM

## 2013-09-18 DIAGNOSIS — Z0000 Encounter for general adult medical examination without abnormal findings: Secondary | ICD-10-CM

## 2013-09-18 DIAGNOSIS — I10 Essential (primary) hypertension: Secondary | ICD-10-CM

## 2013-09-18 LAB — RENAL FUNCTION PANEL
Albumin: 4.2 g/dL (ref 3.5–5.2)
BUN: 12 mg/dL (ref 6–23)
CHLORIDE: 109 meq/L (ref 96–112)
CO2: 24 meq/L (ref 19–32)
Calcium: 9.3 mg/dL (ref 8.4–10.5)
Creat: 0.92 mg/dL (ref 0.50–1.10)
Glucose, Bld: 82 mg/dL (ref 70–99)
Phosphorus: 3.3 mg/dL (ref 2.3–4.6)
Potassium: 4.4 mEq/L (ref 3.5–5.3)
Sodium: 141 mEq/L (ref 135–145)

## 2013-09-18 LAB — HEPATIC FUNCTION PANEL
ALBUMIN: 4.2 g/dL (ref 3.5–5.2)
ALK PHOS: 85 U/L (ref 39–117)
ALT: 14 U/L (ref 0–35)
AST: 19 U/L (ref 0–37)
BILIRUBIN TOTAL: 1 mg/dL (ref 0.2–1.2)
Bilirubin, Direct: 0.2 mg/dL (ref 0.0–0.3)
Indirect Bilirubin: 0.8 mg/dL (ref 0.2–1.2)
Total Protein: 7.3 g/dL (ref 6.0–8.3)

## 2013-09-18 LAB — CBC
HEMATOCRIT: 39.7 % (ref 36.0–46.0)
Hemoglobin: 14.1 g/dL (ref 12.0–15.0)
MCH: 28.2 pg (ref 26.0–34.0)
MCHC: 35.5 g/dL (ref 30.0–36.0)
MCV: 79.4 fL (ref 78.0–100.0)
PLATELETS: 260 10*3/uL (ref 150–400)
RBC: 5 MIL/uL (ref 3.87–5.11)
RDW: 15.2 % (ref 11.5–15.5)
WBC: 3.5 10*3/uL — AB (ref 4.0–10.5)

## 2013-09-18 LAB — TSH: TSH: 1.847 u[IU]/mL (ref 0.350–4.500)

## 2013-09-18 NOTE — Patient Instructions (Signed)
DASH Diet  The DASH diet stands for "Dietary Approaches to Stop Hypertension." It is a healthy eating plan that has been shown to reduce high blood pressure (hypertension) in as little as 14 days, while also possibly providing other significant health benefits. These other health benefits include reducing the risk of breast cancer after menopause and reducing the risk of type 2 diabetes, heart disease, colon cancer, and stroke. Health benefits also include weight loss and slowing kidney failure in patients with chronic kidney disease.   DIET GUIDELINES  · Limit salt (sodium). Your diet should contain less than 1500 mg of sodium daily.  · Limit refined or processed carbohydrates. Your diet should include mostly whole grains. Desserts and added sugars should be used sparingly.  · Include small amounts of heart-healthy fats. These types of fats include nuts, oils, and tub margarine. Limit saturated and trans fats. These fats have been shown to be harmful in the body.  CHOOSING FOODS   The following food groups are based on a 2000 calorie diet. See your Registered Dietitian for individual calorie needs.  Grains and Grain Products (6 to 8 servings daily)  · Eat More Often: Whole-wheat bread, brown rice, whole-grain or wheat pasta, quinoa, popcorn without added fat or salt (air popped).  · Eat Less Often: White bread, white pasta, white rice, cornbread.  Vegetables (4 to 5 servings daily)  · Eat More Often: Fresh, frozen, and canned vegetables. Vegetables may be raw, steamed, roasted, or grilled with a minimal amount of fat.  · Eat Less Often/Avoid: Creamed or fried vegetables. Vegetables in a cheese sauce.  Fruit (4 to 5 servings daily)  · Eat More Often: All fresh, canned (in natural juice), or frozen fruits. Dried fruits without added sugar. One hundred percent fruit juice (½ cup [237 mL] daily).  · Eat Less Often: Dried fruits with added sugar. Canned fruit in light or heavy syrup.  Lean Meats, Fish, and Poultry (2  servings or less daily. One serving is 3 to 4 oz [85-114 g]).  · Eat More Often: Ninety percent or leaner ground beef, tenderloin, sirloin. Round cuts of beef, chicken breast, turkey breast. All fish. Grill, bake, or broil your meat. Nothing should be fried.  · Eat Less Often/Avoid: Fatty cuts of meat, turkey, or chicken leg, thigh, or wing. Fried cuts of meat or fish.  Dairy (2 to 3 servings)  · Eat More Often: Low-fat or fat-free milk, low-fat plain or light yogurt, reduced-fat or part-skim cheese.  · Eat Less Often/Avoid: Milk (whole, 2%). Whole milk yogurt. Full-fat cheeses.  Nuts, Seeds, and Legumes (4 to 5 servings per week)  · Eat More Often: All without added salt.  · Eat Less Often/Avoid: Salted nuts and seeds, canned beans with added salt.  Fats and Sweets (limited)  · Eat More Often: Vegetable oils, tub margarines without trans fats, sugar-free gelatin. Mayonnaise and salad dressings.  · Eat Less Often/Avoid: Coconut oils, palm oils, butter, stick margarine, cream, half and half, cookies, candy, pie.  FOR MORE INFORMATION  The Dash Diet Eating Plan: www.dashdiet.org  Document Released: 05/25/2011 Document Revised: 08/28/2011 Document Reviewed: 05/25/2011  Exit[employer]® Patient Information ©2014 [employer].

## 2013-09-18 NOTE — Progress Notes (Signed)
Pre visit review using our clinic review tool, if applicable. No additional management support is needed unless otherwise documented below in the visit note. 

## 2013-09-19 ENCOUNTER — Encounter: Payer: Self-pay | Admitting: Family Medicine

## 2013-09-19 DIAGNOSIS — Z1239 Encounter for other screening for malignant neoplasm of breast: Secondary | ICD-10-CM | POA: Insufficient documentation

## 2013-09-19 DIAGNOSIS — Z0000 Encounter for general adult medical examination without abnormal findings: Secondary | ICD-10-CM | POA: Insufficient documentation

## 2013-09-19 DIAGNOSIS — T7840XA Allergy, unspecified, initial encounter: Secondary | ICD-10-CM | POA: Insufficient documentation

## 2013-09-19 HISTORY — DX: Allergy, unspecified, initial encounter: T78.40XA

## 2013-09-19 NOTE — Assessment & Plan Note (Signed)
Well controlled, no changes to meds. Encouraged heart healthy diet such as the DASH diet and exercise as tolerated.  °

## 2013-09-19 NOTE — Assessment & Plan Note (Signed)
Patient encouraged to maintain heart healthy diet, regular exercise, adequate sleep. Consider daily probiotics. Take medications as prescribed 

## 2013-09-19 NOTE — Assessment & Plan Note (Signed)
Spring allergies have flared. Uses essential oils to control

## 2013-09-19 NOTE — Assessment & Plan Note (Signed)
Encouraged DASH diet, decrease po intake and increase exercise as tolerated. Needs 7-8 hours of sleep nightly. Avoid trans fats, eat small, frequent meals every 4-5 hours with lean proteins, complex carbs and healthy fats. Minimize simple carbs, GMO foods. 

## 2013-09-19 NOTE — Progress Notes (Signed)
Patient ID: Amber [name], female   DOB: 1956/03/24, 58 y.o.   MRN: [id number] Amber [name] [id number] [birth year] 09/19/2013      Progress Note-Follow Up  Subjective  Chief Complaint  Chief Complaint  Patient presents with  . Annual Exam    fasting for labs- has seen gyn last year for mammogram and pap    HPI  Patient is a 58 year old female in today for routine medical care. She is here today for annual exam doing fairly well and her allergies and she is managing them well she is up-to-date since he was green [facility] in date on mammograms. No recent illness or acute complaints. Denies CP/palp/SOB/HA/congestion/fevers/GI or GU c/o. Taking meds as prescribed  Past Medical History  Diagnosis Date  . Hypertension   . Angioedema   . Measles   . Mumps   . Chicken pox   . Epistaxis 01/15/2013  . Allergic state 09/19/2013    History reviewed. No pertinent past surgical history.  Family History  Problem Relation Age of Onset  . Diabetes Mother 3  . Kidney disease Mother   . Heart disease Mother   . Cancer Father     pancreatic  . Hypertension Other     multiple  . Hypertension Brother   . Diabetes Brother   . Hypertension Sister   . Diabetes Paternal Grandmother   . Stroke Paternal Grandfather   . Hypertension Sister   . Hypertension Sister   . Hypertension Sister     History   Social History  . Marital Status: Married    Spouse Name: N/A    Number of Children: N/A  . Years of Education: N/A   Occupational History  . Not on file.   Social History Main Topics  . Smoking status: Never Smoker   . Smokeless tobacco: Never Used  . Alcohol Use: No  . Drug Use: No  . 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 Not on file   Social History Narrative  . No narrative on file    Current Outpatient Prescriptions on File Prior to Visit  Medication Sig Dispense Refill  . amLODipine  (NORVASC) 10 MG tablet TAKE 1 TABLET BY MOUTH DAILY  90 tablet  0  . carvedilol (COREG) 6.25 MG tablet TAKE 1 TABLET BY MOUTH TWICE DAILY WITH MEAL  60 tablet  0  . Multiple Vitamins-Minerals (MEGA MULTIVITAMIN FOR WOMEN PO) Take 1 tablet by mouth daily.      . mupirocin ointment (BACTROBAN) 2 % Apply topically at bedtime as needed. X 7 days for epistaxis  22 g  1  . Omega-3 Fatty Acids (OMEGA 3 PO) Take 2 capsules by mouth daily.       No current facility-administered medications on file prior to visit.    No Known Allergies  Review of Systems  Review of Systems  Constitutional: Negative for fever, chills and malaise/fatigue.  HENT: Negative for congestion, hearing loss and nosebleeds.   Eyes: Negative for discharge.  Respiratory: Negative for cough, sputum production, shortness of breath and wheezing.   Cardiovascular: Negative for chest pain, palpitations and leg swelling.  Gastrointestinal: Negative for heartburn, nausea, vomiting, abdominal pain, diarrhea, constipation and blood in stool.  Genitourinary: Negative for dysuria, urgency, frequency and hematuria.  Musculoskeletal: Negative for back pain, falls and myalgias.  Skin: Negative for rash.  Neurological: Negative for dizziness, tremors, sensory change,  focal weakness, loss of consciousness, weakness and headaches.  Endo/Heme/Allergies: Negative for polydipsia. Does not bruise/bleed easily.  Psychiatric/Behavioral: Negative for depression and suicidal ideas. The patient is not nervous/anxious and does not have insomnia.     Objective  BP 118/86  Pulse 87  Temp(Src) 98.6 F (37 C) (Oral)  Ht 5\' 8"  (1.727 m)  Wt 196 lb 1.9 oz (88.959 kg)  BMI 29.83 kg/m2  SpO2 98%  Physical Exam  Physical Exam  Constitutional: She is oriented to person, place, and time and well-developed, well-nourished, and in no distress. No distress.  HENT:  Head: Normocephalic and atraumatic.  Right Ear: External ear normal.  Left Ear:  External ear normal.  Nose: Nose normal.  Mouth/Throat: Oropharynx is clear and moist. No oropharyngeal exudate.  Eyes: Conjunctivae are normal. Pupils are equal, round, and reactive to light. Right eye exhibits no discharge. Left eye exhibits no discharge. No scleral icterus.  Neck: Normal range of motion. Neck supple. No thyromegaly present.  Cardiovascular: Normal rate, regular rhythm, normal heart sounds and intact distal pulses.   No murmur heard. Pulmonary/Chest: Effort normal and breath sounds normal. No respiratory distress. She has no wheezes. She has no rales.  Abdominal: Soft. Bowel sounds are normal. She exhibits no distension and no mass. There is no tenderness.  Musculoskeletal: Normal range of motion. She exhibits no edema and no tenderness.  Lymphadenopathy:    She has no cervical adenopathy.  Neurological: She is alert and oriented to person, place, and time. She has normal reflexes. No cranial nerve deficit. Coordination normal.  Skin: Skin is warm and dry. No rash noted. She is not diaphoretic.  Psychiatric: Mood, memory and affect normal.    Lab Results  Component Value Date   TSH 1.847 09/18/2013   Lab Results  Component Value Date   WBC 3.5* 09/18/2013   HGB 14.1 09/18/2013   HCT 39.7 09/18/2013   MCV 79.4 09/18/2013   PLT 260 09/18/2013   Lab Results  Component Value Date   CREATININE 0.92 09/18/2013   BUN 12 09/18/2013   NA 141 09/18/2013   K 4.4 09/18/2013   CL 109 09/18/2013   CO2 24 09/18/2013   Lab Results  Component Value Date   ALT 14 09/18/2013   AST 19 09/18/2013   ALKPHOS 85 09/18/2013   BILITOT 1.0 09/18/2013   Lab Results  Component Value Date   CHOL 175 08/22/2012   Lab Results  Component Value Date   HDL 70 08/22/2012   Lab Results  Component Value Date   LDLCALC 92 08/22/2012   Lab Results  Component Value Date   TRIG 67 08/22/2012   Lab Results  Component Value Date   CHOLHDL 2.5 08/22/2012     Assessment & Plan  WEIGHT GAIN Encouraged DASH diet,  decrease po intake and increase exercise as tolerated. Needs 7-8 hours of sleep nightly. Avoid trans fats, eat small, frequent meals every 4-5 hours with lean proteins, complex carbs and healthy fats. Minimize simple carbs, GMO foods.  Allergic state Spring allergies have flared. Uses essential oils to control  HYPERTENSION Well controlled, no changes to meds. Encouraged heart healthy diet such as the DASH diet and exercise as tolerated.   Preventative health care Patient encouraged to maintain heart healthy diet, regular exercise, adequate sleep. Consider daily probiotics. Take medications as prescribed

## 2013-09-22 ENCOUNTER — Telehealth: Payer: Self-pay | Admitting: Family Medicine

## 2013-09-22 NOTE — Telephone Encounter (Signed)
Relevant patient education assigned to patient using Emmi. ° °

## 2013-10-03 ENCOUNTER — Other Ambulatory Visit: Payer: Self-pay | Admitting: Family Medicine

## 2013-11-30 ENCOUNTER — Other Ambulatory Visit: Payer: Self-pay | Admitting: Family Medicine

## 2014-01-13 ENCOUNTER — Encounter: Payer: Managed Care, Other (non HMO) | Admitting: Family Medicine

## 2014-01-22 ENCOUNTER — Other Ambulatory Visit: Payer: Self-pay | Admitting: Family Medicine

## 2014-02-19 ENCOUNTER — Encounter: Payer: Self-pay | Admitting: Internal Medicine

## 2014-03-18 ENCOUNTER — Other Ambulatory Visit: Payer: Self-pay | Admitting: Family Medicine

## 2014-03-26 ENCOUNTER — Ambulatory Visit: Payer: BC Managed Care – PPO | Admitting: Family Medicine

## 2014-05-28 ENCOUNTER — Ambulatory Visit (INDEPENDENT_AMBULATORY_CARE_PROVIDER_SITE_OTHER): Payer: BC Managed Care – PPO | Admitting: Family Medicine

## 2014-05-28 ENCOUNTER — Encounter: Payer: Self-pay | Admitting: Family Medicine

## 2014-05-28 VITALS — BP 120/78 | HR 90 | Temp 97.6°F | Ht 68.0 in | Wt 203.0 lb

## 2014-05-28 DIAGNOSIS — E663 Overweight: Secondary | ICD-10-CM

## 2014-05-28 DIAGNOSIS — I10 Essential (primary) hypertension: Secondary | ICD-10-CM

## 2014-05-28 MED ORDER — AMLODIPINE BESYLATE 10 MG PO TABS: 10.0000 mg | ORAL_TABLET | Freq: Every day | ORAL | Status: DC

## 2014-05-28 MED ORDER — CARVEDILOL 6.25 MG PO TABS: 6.2500 mg | ORAL_TABLET | Freq: Two times a day (BID) | ORAL | Status: DC

## 2014-05-28 NOTE — Progress Notes (Signed)
Pre visit review using our clinic review tool, if applicable. No additional management support is needed unless otherwise documented below in the visit note. 

## 2014-05-28 NOTE — Patient Instructions (Signed)
Probiotic daily such as Digestive Advantage or Phillip's Colon Health daily   DASH Eating Plan DASH stands for "Dietary Approaches to Stop Hypertension." The DASH eating plan is a healthy eating plan that has been shown to reduce high blood pressure (hypertension). Additional health benefits may include reducing the risk of type 2 diabetes mellitus, heart disease, and stroke. The DASH eating plan may also help with weight loss. WHAT DO I NEED TO KNOW ABOUT THE DASH EATING PLAN? For the DASH eating plan, you will follow these general guidelines:  Choose foods with a percent daily value for sodium of less than 5% (as listed on the food label).  Use salt-free seasonings or herbs instead of table salt or sea salt.  Check with your health care provider or pharmacist before using salt substitutes.  Eat lower-sodium products, often labeled as "lower sodium" or "no salt added."  Eat fresh foods.  Eat more vegetables, fruits, and low-fat dairy products.  Choose whole grains. Look for the word "whole" as the first word in the ingredient list.  Choose fish and skinless chicken or [place] more often than red meat. Limit fish, poultry, and meat to 6 oz (170 g) each day.  Limit sweets, desserts, sugars, and sugary drinks.  Choose heart-healthy fats.  Limit cheese to 1 oz (28 g) per day.  Eat more home-cooked food and less restaurant, buffet, and fast food.  Limit fried foods.  Cook foods using methods other than frying.  Limit canned vegetables. If you do use them, rinse them well to decrease the sodium.  When eating at a restaurant, ask that your food be prepared with less salt, or no salt if possible. WHAT FOODS CAN I EAT? Seek help from a dietitian for individual calorie needs. Grains Whole grain or whole wheat bread. Brown rice. Whole grain or whole wheat pasta. Quinoa, bulgur, and whole grain cereals. Low-sodium cereals. Corn or whole wheat flour tortillas. Whole grain cornbread.  Whole grain crackers. Low-sodium crackers. Vegetables Fresh or frozen vegetables (raw, steamed, roasted, or grilled). Low-sodium or reduced-sodium tomato and vegetable juices. Low-sodium or reduced-sodium tomato sauce and paste. Low-sodium or reduced-sodium canned vegetables.  Fruits All fresh, canned (in natural juice), or frozen fruits. Meat and Other Protein Products Ground beef (85% or leaner), grass-fed beef, or beef trimmed of fat. Skinless chicken or [place]. Ground chicken or [place]. Pork trimmed of fat. All fish and seafood. Eggs. Dried beans, peas, or lentils. Unsalted nuts and seeds. Unsalted canned beans. Dairy Low-fat dairy products, such as skim or 1% milk, 2% or reduced-fat cheeses, low-fat ricotta or cottage cheese, or plain low-fat yogurt. Low-sodium or reduced-sodium cheeses. Fats and Oils Tub margarines without trans fats. Light or reduced-fat mayonnaise and salad dressings (reduced sodium). Avocado. Safflower, olive, or canola oils. Natural peanut or almond butter. Other Unsalted popcorn and pretzels. The items listed above may not be a complete list of recommended foods or beverages. Contact your dietitian for more options. WHAT FOODS ARE NOT RECOMMENDED? Grains White bread. White pasta. White rice. Refined cornbread. Bagels and croissants. Crackers that contain trans fat. Vegetables Creamed or fried vegetables. Vegetables in a cheese sauce. Regular canned vegetables. Regular canned tomato sauce and paste. Regular tomato and vegetable juices. Fruits Dried fruits. Canned fruit in light or heavy syrup. Fruit juice. Meat and Other Protein Products Fatty cuts of meat. Ribs, chicken wings, bacon, sausage, bologna, salami, chitterlings, fatback, hot dogs, bratwurst, and packaged luncheon meats. Salted nuts and seeds. Canned beans with salt. Dairy Whole  or 2% milk, cream, half-and-half, and cream cheese. Whole-fat or sweetened yogurt. Full-fat cheeses or blue cheese. Nondairy  creamers and whipped toppings. Processed cheese, cheese spreads, or cheese curds. Condiments Onion and garlic salt, seasoned salt, table salt, and sea salt. Canned and packaged gravies. Worcestershire sauce. Tartar sauce. Barbecue sauce. Teriyaki sauce. Soy sauce, including reduced sodium. Steak sauce. Fish sauce. Oyster sauce. Cocktail sauce. Horseradish. Ketchup and mustard. Meat flavorings and tenderizers. Bouillon cubes. Hot sauce. Tabasco sauce. Marinades. Taco seasonings. Relishes. Fats and Oils Butter, stick margarine, lard, shortening, ghee, and bacon fat. Coconut, palm kernel, or palm oils. Regular salad dressings. Other Pickles and olives. Salted popcorn and pretzels. The items listed above may not be a complete list of foods and beverages to avoid. Contact your dietitian for more information. WHERE CAN I FIND MORE INFORMATION? National Heart, Lung, and Blood Institute: travelstabloid.com Document Released: 05/25/2011 Document Revised: 10/20/2013 Document Reviewed: 04/09/2013 [facility] Patient Information 2015 [place], [place]. This information is not intended to replace advice given to you by your health care provider. Make sure you discuss any questions you have with your health care provider.

## 2014-06-01 ENCOUNTER — Encounter: Payer: Self-pay | Admitting: Family Medicine

## 2014-06-01 NOTE — Assessment & Plan Note (Signed)
Well controlled, no changes to meds. Encouraged heart healthy diet such as the DASH diet and exercise as tolerated.  °

## 2014-06-01 NOTE — Progress Notes (Signed)
Amber [name]  [id number] Dec 26, 1955 06/01/2014      Progress Note-Follow Up  Subjective  Chief Complaint  Chief Complaint  Patient presents with  . Follow-up    6 month    HPI  Patient is a 58 y.o. female in today for routine medical care. Patient is in today reporting feeling well. Declines flu shot. No recent illness. Notes that she has had no recent significant allergic symptoms or epistaxis. Denies CP/palp/SOB/HA/congestion/fevers/GI or GU c/o. Taking meds as prescribed  Past Medical History  Diagnosis Date  . Hypertension   . Angioedema   . Measles   . Mumps   . Chicken pox   . Epistaxis 01/15/2013  . Allergic state 09/19/2013  . Overweight 08/15/2007    Qualifier: Diagnosis of  By: Nena JordanYoo DO, D. Robert     History reviewed. No pertinent past surgical history.  Family History  Problem Relation Age of Onset  . Diabetes Mother 7478  . Kidney disease Mother   . Heart disease Mother   . Cancer Father     pancreatic  . Hypertension Other     multiple  . Hypertension Brother   . Diabetes Brother   . Hypertension Sister   . Diabetes Paternal Grandmother   . Stroke Paternal Grandfather   . Hypertension Sister   . Hypertension Sister   . Hypertension Sister     History   Social History  . Marital Status: Married    Spouse Name: N/A    Number of Children: N/A  . Years of Education: N/A   Occupational History  . Not on file.   Social History Main Topics  . Smoking status: Never Smoker   . Smokeless tobacco: Never Used  . Alcohol Use: No  . Drug Use: No  . 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 Not on file   Social History Narrative    Current Outpatient Prescriptions on File Prior to Visit  Medication Sig Dispense Refill  . Multiple Vitamins-Minerals (MEGA MULTIVITAMIN FOR WOMEN PO) Take 1 tablet by mouth daily.     No current facility-administered medications on file  prior to visit.    No Known Allergies  Review of Systems  Review of Systems  Constitutional: Negative for fever and malaise/fatigue.  HENT: Negative for congestion.   Eyes: Negative for discharge.  Respiratory: Negative for shortness of breath.   Cardiovascular: Negative for chest pain, palpitations and leg swelling.  Gastrointestinal: Negative for nausea, abdominal pain and diarrhea.  Genitourinary: Negative for dysuria.  Musculoskeletal: Negative for falls.  Skin: Negative for rash.  Neurological: Negative for loss of consciousness and headaches.  Endo/Heme/Allergies: Negative for polydipsia.  Psychiatric/Behavioral: Negative for depression and suicidal ideas. The patient is not nervous/anxious and does not have insomnia.     Objective  BP 120/78 mmHg  Pulse 90  Temp(Src) 97.6 F (36.4 C) (Oral)  Ht 5\' 8"  (1.727 m)  Wt 203 lb (92.08 kg)  BMI 30.87 kg/m2  SpO2 100%  Physical Exam  Physical Exam  Constitutional: She is oriented to person, place, and time and well-developed, well-nourished, and in no distress. No distress.  HENT:  Head: Normocephalic and atraumatic.  Eyes: Conjunctivae are normal.  Neck: Neck supple. No thyromegaly present.  Cardiovascular: Normal rate, regular rhythm and normal heart sounds.   No murmur heard. Pulmonary/Chest: Effort normal and breath sounds normal. She has no wheezes.  Abdominal:  She exhibits no distension and no mass.  Musculoskeletal: She exhibits no edema.  Lymphadenopathy:    She has no cervical adenopathy.  Neurological: She is alert and oriented to person, place, and time.  Skin: Skin is warm and dry. No rash noted. She is not diaphoretic.  Psychiatric: Memory, affect and judgment normal.    Lab Results  Component Value Date   TSH 1.847 09/18/2013   Lab Results  Component Value Date   WBC 3.5* 09/18/2013   HGB 14.1 09/18/2013   HCT 39.7 09/18/2013   MCV 79.4 09/18/2013   PLT 260 09/18/2013   Lab Results    Component Value Date   CREATININE 0.92 09/18/2013   BUN 12 09/18/2013   NA 141 09/18/2013   K 4.4 09/18/2013   CL 109 09/18/2013   CO2 24 09/18/2013   Lab Results  Component Value Date   ALT 14 09/18/2013   AST 19 09/18/2013   ALKPHOS 85 09/18/2013   BILITOT 1.0 09/18/2013   Lab Results  Component Value Date   CHOL 175 08/22/2012   Lab Results  Component Value Date   HDL 70 08/22/2012   Lab Results  Component Value Date   LDLCALC 92 08/22/2012   Lab Results  Component Value Date   TRIG 67 08/22/2012   Lab Results  Component Value Date   CHOLHDL 2.5 08/22/2012     Assessment & Plan  Essential hypertension Well controlled, no changes to meds. Encouraged heart healthy diet such as the DASH diet and exercise as tolerated.   Overweight Encouraged DASH diet, decrease po intake and increase exercise as tolerated. Needs 7-8 hours of sleep nightly. Avoid trans fats, eat small, frequent meals every 4-5 hours with lean proteins, complex carbs and healthy fats. Minimize simple carbs, GMO foods.

## 2014-06-01 NOTE — Assessment & Plan Note (Signed)
Encouraged DASH diet, decrease po intake and increase exercise as tolerated. Needs 7-8 hours of sleep nightly. Avoid trans fats, eat small, frequent meals every 4-5 hours with lean proteins, complex carbs and healthy fats. Minimize simple carbs, GMO foods. 

## 2014-09-22 ENCOUNTER — Encounter: Payer: Self-pay | Admitting: Internal Medicine

## 2014-11-24 ENCOUNTER — Encounter: Payer: Self-pay | Admitting: Internal Medicine

## 2014-11-26 ENCOUNTER — Ambulatory Visit (INDEPENDENT_AMBULATORY_CARE_PROVIDER_SITE_OTHER): Payer: 59 | Admitting: Family Medicine

## 2014-11-26 ENCOUNTER — Encounter: Payer: Self-pay | Admitting: Family Medicine

## 2014-11-26 VITALS — BP 122/78 | HR 95 | Temp 98.6°F | Ht 68.0 in | Wt 192.2 lb

## 2014-11-26 DIAGNOSIS — D72819 Decreased white blood cell count, unspecified: Secondary | ICD-10-CM

## 2014-11-26 DIAGNOSIS — E785 Hyperlipidemia, unspecified: Secondary | ICD-10-CM | POA: Diagnosis not present

## 2014-11-26 DIAGNOSIS — I10 Essential (primary) hypertension: Secondary | ICD-10-CM

## 2014-11-26 DIAGNOSIS — E663 Overweight: Secondary | ICD-10-CM

## 2014-11-26 MED ORDER — AMLODIPINE BESYLATE 10 MG PO TABS: 10.0000 mg | ORAL_TABLET | Freq: Every day | ORAL | Status: DC

## 2014-11-26 MED ORDER — CARVEDILOL 6.25 MG PO TABS: 6.2500 mg | ORAL_TABLET | Freq: Two times a day (BID) | ORAL | Status: DC

## 2014-11-26 NOTE — Patient Instructions (Addendum)
Ghrelin, Leptin are stress hormones  BP< 100-135/60-90, resting  Eating Well and Cooking Light  DASH Eating Plan DASH stands for "Dietary Approaches to Stop Hypertension." The DASH eating plan is a healthy eating plan that has been shown to reduce high blood pressure (hypertension). Additional health benefits may include reducing the risk of type 2 diabetes mellitus, heart disease, and stroke. The DASH eating plan may also help with weight loss. WHAT DO I NEED TO KNOW ABOUT THE DASH EATING PLAN? For the DASH eating plan, you will follow these general guidelines:  Choose foods with a percent daily value for sodium of less than 5% (as listed on the food label).  Use salt-free seasonings or herbs instead of table salt or sea salt.  Check with your health care provider or pharmacist before using salt substitutes.  Eat lower-sodium products, often labeled as "lower sodium" or "no salt added."  Eat fresh foods.  Eat more vegetables, fruits, and low-fat dairy products.  Choose whole grains. Look for the word "whole" as the first word in the ingredient list.  Choose fish and skinless chicken or [place] more often than red meat. Limit fish, poultry, and meat to 6 oz (170 g) each day.  Limit sweets, desserts, sugars, and sugary drinks.  Choose heart-healthy fats.  Limit cheese to 1 oz (28 g) per day.  Eat more home-cooked food and less restaurant, buffet, and fast food.  Limit fried foods.  Cook foods using methods other than frying.  Limit canned vegetables. If you do use them, rinse them well to decrease the sodium.  When eating at a restaurant, ask that your food be prepared with less salt, or no salt if possible. WHAT FOODS CAN I EAT? Seek help from a dietitian for individual calorie needs. Grains Whole grain or whole wheat bread. Brown rice. Whole grain or whole wheat pasta. Quinoa, bulgur, and whole grain cereals. Low-sodium cereals. Corn or whole wheat flour tortillas. Whole  grain cornbread. Whole grain crackers. Low-sodium crackers. Vegetables Fresh or frozen vegetables (raw, steamed, roasted, or grilled). Low-sodium or reduced-sodium tomato and vegetable juices. Low-sodium or reduced-sodium tomato sauce and paste. Low-sodium or reduced-sodium canned vegetables.  Fruits All fresh, canned (in natural juice), or frozen fruits. Meat and Other Protein Products Ground beef (85% or leaner), grass-fed beef, or beef trimmed of fat. Skinless chicken or [place]. Ground chicken or [place]. Pork trimmed of fat. All fish and seafood. Eggs. Dried beans, peas, or lentils. Unsalted nuts and seeds. Unsalted canned beans. Dairy Low-fat dairy products, such as skim or 1% milk, 2% or reduced-fat cheeses, low-fat ricotta or cottage cheese, or plain low-fat yogurt. Low-sodium or reduced-sodium cheeses. Fats and Oils Tub margarines without trans fats. Light or reduced-fat mayonnaise and salad dressings (reduced sodium). Avocado. Safflower, olive, or canola oils. Natural peanut or almond butter. Other Unsalted popcorn and pretzels. The items listed above may not be a complete list of recommended foods or beverages. Contact your dietitian for more options. WHAT FOODS ARE NOT RECOMMENDED? Grains White bread. White pasta. White rice. Refined cornbread. Bagels and croissants. Crackers that contain trans fat. Vegetables Creamed or fried vegetables. Vegetables in a cheese sauce. Regular canned vegetables. Regular canned tomato sauce and paste. Regular tomato and vegetable juices. Fruits Dried fruits. Canned fruit in light or heavy syrup. Fruit juice. Meat and Other Protein Products Fatty cuts of meat. Ribs, chicken wings, bacon, sausage, bologna, salami, chitterlings, fatback, hot dogs, bratwurst, and packaged luncheon meats. Salted nuts and seeds. Canned beans with  salt. Dairy Whole or 2% milk, cream, half-and-half, and cream cheese. Whole-fat or sweetened yogurt. Full-fat cheeses or blue  cheese. Nondairy creamers and whipped toppings. Processed cheese, cheese spreads, or cheese curds. Condiments Onion and garlic salt, seasoned salt, table salt, and sea salt. Canned and packaged gravies. Worcestershire sauce. Tartar sauce. Barbecue sauce. Teriyaki sauce. Soy sauce, including reduced sodium. Steak sauce. Fish sauce. Oyster sauce. Cocktail sauce. Horseradish. Ketchup and mustard. Meat flavorings and tenderizers. Bouillon cubes. Hot sauce. Tabasco sauce. Marinades. Taco seasonings. Relishes. Fats and Oils Butter, stick margarine, lard, shortening, ghee, and bacon fat. Coconut, palm kernel, or palm oils. Regular salad dressings. Other Pickles and olives. Salted popcorn and pretzels. The items listed above may not be a complete list of foods and beverages to avoid. Contact your dietitian for more information. WHERE CAN I FIND MORE INFORMATION? National Heart, Lung, and Blood Institute: [employer] Document Released: 05/25/2011 Document Revised: 10/20/2013 Document Reviewed: 04/09/2013 [facility] Patient Information 2015 [place], [place]. This information is not intended to replace advice given to you by your health care provider. Make sure you discuss any questions you have with your health care provider.    Obesity Obesity is defined as having too much total body fat and a body mass index (BMI) of 30 or more. BMI is an estimate of body fat and is calculated from your height and weight. Obesity happens when you consume more calories than you can burn by exercising or performing daily physical tasks. Prolonged obesity can cause major illnesses or emergencies, such as:   Stroke.  Heart disease.  Diabetes.  Cancer.  Arthritis.  High blood pressure (hypertension).  High cholesterol.  Sleep apnea.  Erectile dysfunction.  Infertility problems. CAUSES   Regularly eating unhealthy foods.  Physical inactivity.  Certain disorders, such  as an underactive thyroid (hypothyroidism), Cushing's syndrome, and polycystic ovarian syndrome.  Certain medicines, such as steroids, some depression medicines, and antipsychotics.  Genetics.  Lack of sleep. DIAGNOSIS  A health care provider can diagnose obesity after calculating your BMI. Obesity will be diagnosed if your BMI is 30 or higher.  There are other methods of measuring obesity levels. Some other methods include measuring your skinfold thickness, your waist circumference, and comparing your hip circumference to your waist circumference. TREATMENT  A healthy treatment program includes some or all of the following:  Long-term dietary changes.  Exercise and physical activity.  Behavioral and lifestyle changes.  Medicine only under the supervision of your health care provider. Medicines may help, but only if they are used with diet and exercise programs. An unhealthy treatment program includes:  Fasting.  Fad diets.  Supplements and drugs. These choices do not succeed in long-term weight control.  HOME CARE INSTRUCTIONS   Exercise and perform physical activity as directed by your health care provider. To increase physical activity, try the following:  Use stairs instead of elevators.  Park farther away from store entrances.  Garden, bike, or walk instead of watching television or using the computer.  Eat healthy, low-calorie foods and drinks on a regular basis. Eat more fruits and vegetables. Use low-calorie cookbooks or take healthy cooking classes.  Limit fast food, sweets, and processed snack foods.  Eat smaller portions.  Keep a daily journal of everything you eat. There are many free websites to help you with this. It may be helpful to measure your foods so you can determine if you are eating the correct portion sizes.  Avoid drinking alcohol. Drink more water and drinks without  calories.  Take vitamins and supplements only as recommended by your health  care provider.  Weight-loss support groups, Government social research officer, counselors, and stress reduction education can also be very helpful. SEEK IMMEDIATE MEDICAL CARE IF:  You have chest pain or tightness.  You have trouble breathing or feel short of breath.  You have weakness or leg numbness.  You feel confused or have trouble talking.  You have sudden changes in your vision. MAKE SURE YOU:  Understand these instructions.  Will watch your condition.  Will get help right away if you are not doing well or get worse. Document Released: 07/13/2004 Document Revised: 10/20/2013 Document Reviewed: 07/12/2011 [facility] Patient Information 2015 [place], [place]. This information is not intended to replace advice given to you by your health care provider. Make sure you discuss any questions you have with your health care provider.

## 2014-12-06 ENCOUNTER — Encounter: Payer: Self-pay | Admitting: Family Medicine

## 2014-12-06 DIAGNOSIS — D72819 Decreased white blood cell count, unspecified: Secondary | ICD-10-CM

## 2014-12-06 HISTORY — DX: Decreased white blood cell count, unspecified: D72.819

## 2014-12-06 NOTE — Assessment & Plan Note (Signed)
Well controlled, no changes to meds. Encouraged heart healthy diet such as the DASH diet and exercise as tolerated.  °

## 2014-12-06 NOTE — Progress Notes (Signed)
Amber [name]  [id number] 1956-02-01 12/06/2014      Progress Note-Follow Up  Subjective  Chief Complaint  Chief Complaint  Patient presents with  . Follow-up    6 month    HPI  Patient is a 59 y.o. female in today for routine medical care.  Patient is in today for follow-up and generally feeling well. No recent illness. She reports trying to stay active and follow heart healthy diet but acknowledges she does not follow with daily. No recent hospitalizations. Allergies generally well controlled. Denies CP/palp/SOB/HA/congestion/fevers/GI or GU c/o. Taking meds as prescribed  Past Medical History  Diagnosis Date  . Hypertension   . Angioedema   . Measles   . Mumps   . Chicken pox   . Epistaxis 01/15/2013  . Allergic state 09/19/2013  . Overweight 08/15/2007    Qualifier: Diagnosis of  By: Nena Jordan   . Leukopenia 12/06/2014    History reviewed. No pertinent past surgical history.  Family History  Problem Relation Age of Onset  . Diabetes Mother 79  . Kidney disease Mother   . Heart disease Mother   . Cancer Father     pancreatic  . Hypertension Other     multiple  . Hypertension Brother   . Diabetes Brother   . Hypertension Sister   . Diabetes Paternal Grandmother   . Stroke Paternal Grandfather   . Hypertension Sister   . Hypertension Sister   . Hypertension Sister     History   Social History  . Marital Status: Married    Spouse Name: N/A  . Number of Children: N/A  . Years of Education: N/A   Occupational History  . Not on file.   Social History Main Topics  . Smoking status: Never Smoker   . Smokeless tobacco: Never Used  . Alcohol Use: No  . Drug Use: No  . 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 Not on file   Social History Narrative    Current Outpatient Prescriptions on File Prior to Visit  Medication Sig Dispense Refill  . Multiple Vitamins-Minerals  (MEGA MULTIVITAMIN FOR WOMEN PO) Take 1 tablet by mouth daily.     No current facility-administered medications on file prior to visit.    No Known Allergies  Review of Systems  Review of Systems  Constitutional: Negative for fever and malaise/fatigue.  HENT: Negative for congestion.   Eyes: Negative for discharge.  Respiratory: Negative for shortness of breath.   Cardiovascular: Negative for chest pain, palpitations and leg swelling.  Gastrointestinal: Negative for nausea, abdominal pain and diarrhea.  Genitourinary: Negative for dysuria.  Musculoskeletal: Negative for falls.  Skin: Negative for rash.  Neurological: Negative for loss of consciousness and headaches.  Endo/Heme/Allergies: Negative for polydipsia.  Psychiatric/Behavioral: Negative for depression and suicidal ideas. The patient is not nervous/anxious and does not have insomnia.     Objective  BP 122/78 mmHg  Pulse 95  Temp(Src) 98.6 F (37 C) (Oral)  Ht  (1.727 m)  Wt 192 lb 4 oz (87.204 kg)  BMI 29.24 kg/m2  SpO2 96%  Physical Exam  Physical Exam  Constitutional: She is oriented to person, place, and time and well-developed, well-nourished, and in no distress. No distress.  HENT:  Head: Normocephalic and atraumatic.  Eyes: Conjunctivae are normal.  Neck: Neck supple. No thyromegaly present.  Cardiovascular: Normal rate, regular rhythm and normal  heart sounds.   No murmur heard. Pulmonary/Chest: Effort normal and breath sounds normal. She has no wheezes.  Abdominal: She exhibits no distension and no mass.  Musculoskeletal: She exhibits no edema.  Lymphadenopathy:    She has no cervical adenopathy.  Neurological: She is alert and oriented to person, place, and time.  Skin: Skin is warm and dry. No rash noted. She is not diaphoretic.  Psychiatric: Memory, affect and judgment normal.    Lab Results  Component Value Date   TSH 1.847 09/18/2013   Lab Results  Component Value Date   WBC 3.5*  09/18/2013   HGB 14.1 09/18/2013   HCT 39.7 09/18/2013   MCV 79.4 09/18/2013   PLT 260 09/18/2013   Lab Results  Component Value Date   CREATININE 0.92 09/18/2013   BUN 12 09/18/2013   NA 141 09/18/2013   K 4.4 09/18/2013   CL 109 09/18/2013   CO2 24 09/18/2013   Lab Results  Component Value Date   ALT 14 09/18/2013   AST 19 09/18/2013   ALKPHOS 85 09/18/2013   BILITOT 1.0 09/18/2013   Lab Results  Component Value Date   CHOL 175 08/22/2012   Lab Results  Component Value Date   HDL 70 08/22/2012   Lab Results  Component Value Date   LDLCALC 92 08/22/2012   Lab Results  Component Value Date   TRIG 67 08/22/2012   Lab Results  Component Value Date   CHOLHDL 2.5 08/22/2012     Assessment & Plan  Essential hypertension Well controlled, no changes to meds. Encouraged heart healthy diet such as the DASH diet and exercise as tolerated.   Overweight Encouraged DASH diet, decrease po intake and increase exercise as tolerated. Needs 7-8 hours of sleep nightly. Avoid trans fats, eat small, frequent meals every 4-5 hours with lean proteins, complex carbs and healthy fats. Minimize simple carbs  Leukopenia Mild, ordered repeat CBC but patient did not proceed. asymptomatic

## 2014-12-06 NOTE — Assessment & Plan Note (Signed)
Encouraged DASH diet, decrease po intake and increase exercise as tolerated. Needs 7-8 hours of sleep nightly. Avoid trans fats, eat small, frequent meals every 4-5 hours with lean proteins, complex carbs and healthy fats. Minimize simple carbs 

## 2014-12-06 NOTE — Assessment & Plan Note (Signed)
Mild, ordered repeat CBC but patient did not proceed. asymptomatic

## 2015-05-26 ENCOUNTER — Other Ambulatory Visit: Payer: 59

## 2015-05-27 ENCOUNTER — Other Ambulatory Visit: Payer: 59

## 2015-05-27 ENCOUNTER — Other Ambulatory Visit (INDEPENDENT_AMBULATORY_CARE_PROVIDER_SITE_OTHER): Payer: 59

## 2015-05-27 DIAGNOSIS — E785 Hyperlipidemia, unspecified: Secondary | ICD-10-CM

## 2015-05-27 DIAGNOSIS — I10 Essential (primary) hypertension: Secondary | ICD-10-CM | POA: Diagnosis not present

## 2015-05-27 LAB — COMPREHENSIVE METABOLIC PANEL
ALBUMIN: 4.2 g/dL (ref 3.5–5.2)
ALT: 10 U/L (ref 0–35)
AST: 16 U/L (ref 0–37)
Alkaline Phosphatase: 75 U/L (ref 39–117)
BUN: 14 mg/dL (ref 6–23)
CHLORIDE: 108 meq/L (ref 96–112)
CO2: 26 meq/L (ref 19–32)
Calcium: 9.4 mg/dL (ref 8.4–10.5)
Creatinine, Ser: 0.86 mg/dL (ref 0.40–1.20)
GFR: 87.11 mL/min (ref >60.00)
Glucose, Bld: 86 mg/dL (ref 70–99)
POTASSIUM: 3.7 meq/L (ref 3.5–5.1)
SODIUM: 142 meq/L (ref 135–145)
Total Bilirubin: 0.9 mg/dL (ref 0.2–1.2)
Total Protein: 7.5 g/dL (ref 6.0–8.3)

## 2015-05-27 LAB — LIPID PANEL
CHOL/HDL RATIO: 3
CHOLESTEROL: 153 mg/dL (ref 0–200)
HDL: 60.7 mg/dL (ref >39.00)
LDL Cholesterol: 84 mg/dL (ref 0–99)
NonHDL: 92.65
Triglycerides: 44 mg/dL (ref 0.0–149.0)
VLDL: 8.8 mg/dL (ref 0.0–40.0)

## 2015-05-27 LAB — CBC
HEMATOCRIT: 42.4 % (ref 36.0–46.0)
Hemoglobin: 14 g/dL (ref 12.0–15.0)
MCHC: 33 g/dL (ref 30.0–36.0)
MCV: 84.4 fl (ref 78.0–100.0)
Platelets: 251 10*3/uL (ref 150.0–400.0)
RBC: 5.03 Mil/uL (ref 3.87–5.11)
RDW: 15.3 % (ref 11.5–15.5)
WBC: 4 10*3/uL (ref 4.0–10.5)

## 2015-05-27 LAB — TSH: TSH: 3.32 u[IU]/mL (ref 0.35–4.50)

## 2015-05-28 ENCOUNTER — Encounter: Payer: Self-pay | Admitting: Family Medicine

## 2015-06-03 ENCOUNTER — Ambulatory Visit (INDEPENDENT_AMBULATORY_CARE_PROVIDER_SITE_OTHER): Payer: 59 | Admitting: Family Medicine

## 2015-06-03 ENCOUNTER — Encounter: Payer: Self-pay | Admitting: Family Medicine

## 2015-06-03 VITALS — BP 120/80 | HR 80 | Temp 97.6°F | Ht 68.0 in | Wt 188.2 lb

## 2015-06-03 DIAGNOSIS — E663 Overweight: Secondary | ICD-10-CM

## 2015-06-03 DIAGNOSIS — I10 Essential (primary) hypertension: Secondary | ICD-10-CM

## 2015-06-03 DIAGNOSIS — Z1159 Encounter for screening for other viral diseases: Secondary | ICD-10-CM | POA: Diagnosis not present

## 2015-06-03 MED ORDER — AMLODIPINE BESYLATE 10 MG PO TABS: 10.0000 mg | ORAL_TABLET | Freq: Every day | ORAL | Status: DC

## 2015-06-03 MED ORDER — CARVEDILOL 6.25 MG PO TABS: 6.2500 mg | ORAL_TABLET | Freq: Two times a day (BID) | ORAL | Status: DC

## 2015-06-03 NOTE — Patient Instructions (Addendum)
Partially hydrogenated oils, avoid them also known as trans fats   Hypertension Hypertension, commonly called high blood pressure, is when the force of blood pumping through your arteries is too strong. Your arteries are the blood vessels that carry blood from your heart throughout your body. A blood pressure reading consists of a higher number over a lower number, such as 110/72. The higher number (systolic) is the pressure inside your arteries when your heart pumps. The lower number (diastolic) is the pressure inside your arteries when your heart relaxes. Ideally you want your blood pressure below 120/80. Hypertension forces your heart to work harder to pump blood. Your arteries may become narrow or stiff. Having untreated or uncontrolled hypertension can cause heart attack, stroke, kidney disease, and other problems. RISK FACTORS Some risk factors for high blood pressure are controllable. Others are not.  Risk factors you cannot control include:   Race. You may be at higher risk if you are African American.  Age. Risk increases with age.  Gender. Men are at higher risk than women before age 60 years. After age 27, women are at higher risk than men. Risk factors you can control include:  Not getting enough exercise or physical activity.  Being overweight.  Getting too much fat, sugar, calories, or salt in your diet.  Drinking too much alcohol. SIGNS AND SYMPTOMS Hypertension does not usually cause signs or symptoms. Extremely high blood pressure (hypertensive crisis) may cause headache, anxiety, shortness of breath, and nosebleed. DIAGNOSIS To check if you have hypertension, your health care provider will measure your blood pressure while you are seated, with your arm held at the level of your heart. It should be measured at least twice using the same arm. Certain conditions can cause a difference in blood pressure between your right and left arms. A blood pressure reading that is higher  than normal on one occasion does not mean that you need treatment. If it is not clear whether you have high blood pressure, you may be asked to return on a different day to have your blood pressure checked again. Or, you may be asked to monitor your blood pressure at home for 1 or more weeks. TREATMENT Treating high blood pressure includes making lifestyle changes and possibly taking medicine. Living a healthy lifestyle can help lower high blood pressure. You may need to change some of your habits. Lifestyle changes may include:  Following the DASH diet. This diet is high in fruits, vegetables, and whole grains. It is low in salt, red meat, and added sugars.  Keep your sodium intake below 2,300 mg per day.  Getting at least 30-45 minutes of aerobic exercise at least 4 times per week.  Losing weight if necessary.  Not smoking.  Limiting alcoholic beverages.  Learning ways to reduce stress. Your health care provider may prescribe medicine if lifestyle changes are not enough to get your blood pressure under control, and if one of the following is true:  You are 49-15 years of age and your systolic blood pressure is above 140.  You are 28 years of age or older, and your systolic blood pressure is above 150.  Your diastolic blood pressure is above 90.  You have diabetes, and your systolic blood pressure is over 140 or your diastolic blood pressure is over 90.  You have kidney disease and your blood pressure is above 140/90.  You have heart disease and your blood pressure is above 140/90. Your personal target blood pressure may vary depending  on your medical conditions, your age, and other factors. HOME CARE INSTRUCTIONS  Have your blood pressure rechecked as directed by your health care provider.   Take medicines only as directed by your health care provider. Follow the directions carefully. Blood pressure medicines must be taken as prescribed. The medicine does not work as well when  you skip doses. Skipping doses also puts you at risk for problems.  Do not smoke.   Monitor your blood pressure at home as directed by your health care provider. SEEK MEDICAL CARE IF:   You think you are having a reaction to medicines taken.  You have recurrent headaches or feel dizzy.  You have swelling in your ankles.  You have trouble with your vision. SEEK IMMEDIATE MEDICAL CARE IF:  You develop a severe headache or confusion.  You have unusual weakness, numbness, or feel faint.  You have severe chest or abdominal pain.  You vomit repeatedly.  You have trouble breathing. MAKE SURE YOU:   Understand these instructions.  Will watch your condition.  Will get help right away if you are not doing well or get worse.   This information is not intended to replace advice given to you by your health care provider. Make sure you discuss any questions you have with your health care provider.   Document Released: 06/05/2005 Document Revised: 10/20/2014 Document Reviewed: 03/28/2013 Elsevier Interactive Patient Education [employer]2016 Elsevier Inc.

## 2015-06-06 ENCOUNTER — Encounter: Payer: Self-pay | Admitting: Family Medicine

## 2015-06-06 NOTE — Progress Notes (Signed)
Subjective:    Patient ID: Amber [name], female    DOB: [birth year], 59 y.o.   MRN: [id number]  Chief Complaint  Patient presents with  . Follow-up    HPI Patient is in today for follow-up. She is feeling well today. She denies any recent illness or acute concerns. Is trying to remain active and maintain a heart healthy diet. Denies CP/palp/SOB/HA/congestion/fevers/GI or GU c/o. Taking meds as prescribed  Past Medical History  Diagnosis Date  . Hypertension   . Angioedema   . Measles   . Mumps   . Chicken pox   . Epistaxis 01/15/2013  . Allergic state 09/19/2013  . Overweight 08/15/2007    Qualifier: Diagnosis of  By: Nena JordanYoo DO, D. Robert   . Leukopenia 12/06/2014    History reviewed. No pertinent past surgical history.  Family History  Problem Relation Age of Onset  . Diabetes Mother [id number]  . Kidney disease Mother   . Heart disease Mother   . Cancer Father     pancreatic  . Hypertension Other     multiple  . Hypertension Brother   . Diabetes Brother   . Hypertension Sister   . Diabetes Paternal Grandmother   . Stroke Paternal Grandfather   . Hypertension Sister   . Hypertension Sister   . Hypertension Sister     Social History   Social History  . Marital Status: Married    Spouse Name: N/A  . Number of Children: N/A  . Years of Education: N/A   Occupational History  . Not on file.   Social History Main Topics  . Smoking status: Never Smoker   . Smokeless tobacco: Never Used  . Alcohol Use: No  . Drug Use: No  . 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 Not on file   Social History Narrative    Outpatient Prescriptions Prior to Visit  Medication Sig Dispense Refill  . amLODipine (NORVASC) 10 MG tablet Take 1 tablet (10 mg total) by mouth daily. 30 tablet 11  . carvedilol (COREG) 6.25 MG tablet Take 1 tablet (6.25 mg total) by mouth 2 (two) times daily with a meal. 60 tablet 11    . Multiple Vitamins-Minerals (MEGA MULTIVITAMIN FOR WOMEN PO) Take 1 tablet by mouth daily. Reported on 06/03/2015     No facility-administered medications prior to visit.    No Known Allergies  Review of Systems  Constitutional: Negative for fever and malaise/fatigue.  HENT: Negative for congestion.   Eyes: Negative for discharge.  Respiratory: Negative for shortness of breath.   Cardiovascular: Negative for chest pain, palpitations and leg swelling.  Gastrointestinal: Negative for nausea and abdominal pain.  Genitourinary: Negative for dysuria.  Musculoskeletal: Negative for falls.  Skin: Negative for rash.  Neurological: Negative for loss of consciousness and headaches.  Endo/Heme/Allergies: Negative for environmental allergies.  Psychiatric/Behavioral: Negative for depression. The patient is not nervous/anxious.        Objective:    Physical Exam  Constitutional: She is oriented to person, place, and time. She appears well-developed and well-nourished. No distress.  HENT:  Head: Normocephalic and atraumatic.  Nose: Nose normal.  Eyes: Right eye exhibits no discharge. Left eye exhibits no discharge.  Neck: Normal range of motion. Neck supple.  Cardiovascular: Normal rate and regular rhythm.   No murmur heard. Pulmonary/Chest: Effort normal and breath sounds normal.  Abdominal: Soft. Bowel sounds are normal. There  is no tenderness.  Musculoskeletal: She exhibits no edema.  Neurological: She is alert and oriented to person, place, and time.  Skin: Skin is warm and dry.  Psychiatric: She has a normal mood and affect.  Nursing note and vitals reviewed.   BP 120/80 mmHg  Pulse 80  Temp(Src) 97.6 F (36.4 C) (Oral)  Ht  (1.727 m)  Wt 188 lb 4 oz (85.39 kg)  BMI 28.63 kg/m2  SpO2 98% Wt Readings from Last 3 Encounters:  06/03/15 188 lb 4 oz (85.39 kg)  11/26/14 192 lb 4 oz (87.204 kg)  05/28/14 203 lb (92.08 kg)     Lab Results  Component Value Date    WBC 4.0 05/27/2015   HGB 14.0 05/27/2015   HCT 42.4 05/27/2015   PLT 251.0 05/27/2015   GLUCOSE 86 05/27/2015   CHOL 153 05/27/2015   TRIG 44.0 05/27/2015   HDL 60.70 05/27/2015   LDLCALC 84 05/27/2015   ALT 10 05/27/2015   AST 16 05/27/2015   NA 142 05/27/2015   K 3.7 05/27/2015   CL 108 05/27/2015   CREATININE 0.86 05/27/2015   BUN 14 05/27/2015   CO2 26 05/27/2015   TSH 3.32 05/27/2015    Lab Results  Component Value Date   TSH 3.32 05/27/2015   Lab Results  Component Value Date   WBC 4.0 05/27/2015   HGB 14.0 05/27/2015   HCT 42.4 05/27/2015   MCV 84.4 05/27/2015   PLT 251.0 05/27/2015   Lab Results  Component Value Date   NA 142 05/27/2015   K 3.7 05/27/2015   CO2 26 05/27/2015   GLUCOSE 86 05/27/2015   BUN 14 05/27/2015   CREATININE 0.86 05/27/2015   BILITOT 0.9 05/27/2015   ALKPHOS 75 05/27/2015   AST 16 05/27/2015   ALT 10 05/27/2015   PROT 7.5 05/27/2015   ALBUMIN 4.2 05/27/2015   CALCIUM 9.4 05/27/2015   GFR 87.11 05/27/2015   Lab Results  Component Value Date   CHOL 153 05/27/2015   Lab Results  Component Value Date   HDL 60.70 05/27/2015   Lab Results  Component Value Date   LDLCALC 84 05/27/2015   Lab Results  Component Value Date   TRIG 44.0 05/27/2015   Lab Results  Component Value Date   CHOLHDL 3 05/27/2015   No results found for: HGBA1C     Assessment & Plan:   Problem List Items Addressed This Visit    Essential hypertension    Well controlled, no changes to meds. Encouraged heart healthy diet such as the DASH diet and exercise as tolerated.       Relevant Medications   carvedilol (COREG) 6.25 MG tablet   amLODipine (NORVASC) 10 MG tablet   Other Relevant Orders   Comprehensive metabolic panel   TSH   CBC   Overweight    Encouraged DASH diet, decrease po intake and increase exercise as tolerated. Needs 7-8 hours of sleep nightly. Avoid trans fats, eat small, frequent meals every 4-5 hours with lean proteins,  complex carbs and healthy fats. Minimize simple carbs      Relevant Orders   Comprehensive metabolic panel   TSH   CBC    Other Visit Diagnoses    Screening for viral disease    -  Primary    Relevant Orders    Comprehensive metabolic panel    TSH    CBC    HIV antibody (with reflex)    Hepatitis C antibody  I am having Amber [name] maintain her Multiple Vitamins-Minerals (MEGA MULTIVITAMIN FOR WOMEN PO), carvedilol, and amLODipine.  Meds ordered this encounter  Medications  . carvedilol (COREG) 6.25 MG tablet    Sig: Take 1 tablet (6.25 mg total) by mouth 2 (two) times daily with a meal.    Dispense:  60 tablet    Refill:  11  . amLODipine (NORVASC) 10 MG tablet    Sig: Take 1 tablet (10 mg total) by mouth daily.    Dispense:  30 tablet    Refill:  11     Danise Edge, MD

## 2015-06-06 NOTE — Assessment & Plan Note (Signed)
Well controlled, no changes to meds. Encouraged heart healthy diet such as the DASH diet and exercise as tolerated.  °

## 2015-06-06 NOTE — Assessment & Plan Note (Signed)
Encouraged DASH diet, decrease po intake and increase exercise as tolerated. Needs 7-8 hours of sleep nightly. Avoid trans fats, eat small, frequent meals every 4-5 hours with lean proteins, complex carbs and healthy fats. Minimize simple carbs 

## 2015-07-01 ENCOUNTER — Other Ambulatory Visit: Payer: 59

## 2015-10-20 ENCOUNTER — Ambulatory Visit (INDEPENDENT_AMBULATORY_CARE_PROVIDER_SITE_OTHER): Payer: BLUE CROSS/BLUE SHIELD | Admitting: Physician Assistant

## 2015-10-20 ENCOUNTER — Encounter: Payer: Self-pay | Admitting: Physician Assistant

## 2015-10-20 VITALS — BP 118/80 | HR 80 | Temp 97.9°F | Resp 16 | Ht 68.0 in | Wt 191.4 lb

## 2015-10-20 DIAGNOSIS — S8991XA Unspecified injury of right lower leg, initial encounter: Secondary | ICD-10-CM | POA: Diagnosis not present

## 2015-10-20 NOTE — Progress Notes (Signed)
Patient presents to clinic today c/o anterior R knee pain x 5 days after sustaining a fall outside on her concrete walkway. Patient endorses she was walking and stumbled on some uneven pavement. Fell onto her right knee and hand. Denies head injury/trauma. Pain is described as constant and aching. Worse with flexion of knee. Denies swelling but endorses icing the knee right after fall. Has been soaking in Epson salt.   Past Medical History  Diagnosis Date  . Hypertension   . Angioedema   . Measles   . Mumps   . Chicken pox   . Epistaxis 01/15/2013  . Allergic state 09/19/2013  . Overweight 08/15/2007    Qualifier: Diagnosis of  By: Nena JordanYoo DO, D. Robert   . Leukopenia 12/06/2014    Current Outpatient Prescriptions on File Prior to Visit  Medication Sig Dispense Refill  . amLODipine (NORVASC) 10 MG tablet Take 1 tablet (10 mg total) by mouth daily. 30 tablet 11  . carvedilol (COREG) 6.25 MG tablet Take 1 tablet (6.25 mg total) by mouth 2 (two) times daily with a meal. 60 tablet 11  . Multiple Vitamins-Minerals (MEGA MULTIVITAMIN FOR WOMEN PO) Take 1 tablet by mouth daily. Reported on 10/20/2015     No current facility-administered medications on file prior to visit.    No Known Allergies  Family History  Problem Relation Age of Onset  . Diabetes Mother 4578  . Kidney disease Mother   . Heart disease Mother   . Cancer Father     pancreatic  . Hypertension Other     multiple  . Hypertension Brother   . Diabetes Brother   . Hypertension Sister   . Diabetes Paternal Grandmother   . Stroke Paternal Grandfather   . Hypertension Sister   . Hypertension Sister   . Hypertension Sister     Social History   Social History  . Marital Status: Married    Spouse Name: N/A  . Number of Children: N/A  . Years of Education: N/A   Social History Main Topics  . Smoking status: Never Smoker   . Smokeless tobacco: Never Used  . Alcohol Use: No  . Drug Use: No  . 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 None   Social History Narrative   Review of Systems - See HPI.  All other ROS are negative.  Ht 5\' 8"  (1.727 m)  Wt 191 lb 6 oz (86.807 kg)  BMI 29.11 kg/m2  Physical Exam  Constitutional: She is oriented to person, place, and time and well-developed, well-nourished, and in no distress.  HENT:  Head: Normocephalic and atraumatic.  Eyes: Conjunctivae are normal.  Cardiovascular: Normal rate, regular rhythm, normal heart sounds and intact distal pulses.   Pulmonary/Chest: Effort normal and breath sounds normal. No respiratory distress. She has no wheezes. She has no rales. She exhibits no tenderness.  Neurological: She is alert and oriented to person, place, and time.  Skin: Skin is warm and dry. No rash noted.  Psychiatric: Affect normal.  Vitals reviewed.   No results found for this or any previous visit (from the past 2160 hour(s)).  Assessment/Plan: 1. Knee injury, right, initial encounter Mild skin abrasion over patella. Examination with tenderness over patellar tendon. No other abnormality noted. ROM and strength preserved. Do not feel imaging is needed presently. RICE. ACE wrap provided. Topical Aspercreme as directed. OTC medications reviewed. FU if not resolving.

## 2015-10-20 NOTE — Progress Notes (Signed)
Pre visit review using our clinic review tool, if applicable. No additional management support is needed unless otherwise documented below in the visit note/SLS  

## 2015-10-20 NOTE — Patient Instructions (Signed)
Please get a knee sleeve from the pharmacy (an ACE wrap will work as well) to add compression to the knee which will alleviate pain. Continue your soaks. Apply [facility] or Aspercreme to the area. Keep moving but don't overdue it. Elevate the leg while resting.  If symptoms are not improving over the next few days, let me know and we can proceed with imaging but I do not feel imaging is needed.

## 2015-10-30 ENCOUNTER — Ambulatory Visit (INDEPENDENT_AMBULATORY_CARE_PROVIDER_SITE_OTHER): Payer: BLUE CROSS/BLUE SHIELD | Admitting: Family Medicine

## 2015-10-30 ENCOUNTER — Ambulatory Visit (HOSPITAL_COMMUNITY)
Admission: RE | Admit: 2015-10-30 | Discharge: 2015-10-30 | Disposition: A | Discharge status: Home / Self Care | Payer: BLUE CROSS/BLUE SHIELD | Source: Ambulatory Visit | Attending: Family Medicine | Admitting: Family Medicine

## 2015-10-30 ENCOUNTER — Encounter: Payer: Self-pay | Admitting: Family Medicine

## 2015-10-30 VITALS — BP 120/80 | HR 81 | Temp 97.8°F | Resp 18 | Ht 68.0 in | Wt 190.8 lb

## 2015-10-30 DIAGNOSIS — S8991XD Unspecified injury of right lower leg, subsequent encounter: Secondary | ICD-10-CM

## 2015-10-30 DIAGNOSIS — X58XXXD Exposure to other specified factors, subsequent encounter: Secondary | ICD-10-CM | POA: Diagnosis not present

## 2015-10-30 DIAGNOSIS — S8991XA Unspecified injury of right lower leg, initial encounter: Secondary | ICD-10-CM | POA: Insufficient documentation

## 2015-10-30 DIAGNOSIS — M25561 Pain in right knee: Secondary | ICD-10-CM | POA: Diagnosis not present

## 2015-10-30 NOTE — Patient Instructions (Signed)
I think this is knee cap bruise or bony contusion - should continue to heal well. [facility] R knee x[facility] today. We will be in touch with results.  Continue rest, compression, elevation of knee. Ok to continue ice and epsom salt soaks.

## 2015-10-30 NOTE — Progress Notes (Signed)
BP 120/80 mmHg  Pulse 81  Temp(Src) 97.8 F (36.6 C) (Oral)  Resp 18  Ht 5' 8"  (1.727 m)  Wt 190 lb 12 oz (86.524 kg)  BMI 29.01 kg/m2  SpO2 98%   CC: check R knee  Subjective:    Patient ID: Amber [name], female    DOB: 22-May-1956, 60 y.o.   MRN: [id number]  HPI: EMMAGRACE RUNKEL is a 61 y.o. female presenting on 10/30/2015 for Cyst   Suffered fall 2 wks ago onto R knee onto concrete. Ongoing pain. She did treat with ice and epsom salt soaks. Mechanical fall.   Seen at [facility] 10/20/2015. Note reviewed.   No h/o knee trouble in the past.    Relevant past medical, surgical, family and social history reviewed and updated as indicated. Interim medical history since our last visit reviewed. Allergies and medications reviewed and updated. Current Outpatient Prescriptions on File Prior to Visit  Medication Sig  . amLODipine (NORVASC) 10 MG tablet Take 1 tablet (10 mg total) by mouth daily.  . carvedilol (COREG) 6.25 MG tablet Take 1 tablet (6.25 mg total) by mouth 2 (two) times daily with a meal.  . Multiple Vitamins-Minerals (MEGA MULTIVITAMIN FOR WOMEN PO) Take 1 tablet by mouth daily. Reported on 10/20/2015   No current facility-administered medications on file prior to visit.    Review of Systems Per HPI unless specifically indicated in ROS section     Objective:    BP 120/80 mmHg  Pulse 81  Temp(Src) 97.8 F (36.6 C) (Oral)  Resp 18  Ht 5' 8"  (1.727 m)  Wt 190 lb 12 oz (86.524 kg)  BMI 29.01 kg/m2  SpO2 98%  Wt Readings from Last 3 Encounters:  10/30/15 190 lb 12 oz (86.524 kg)  10/20/15 191 lb 6 oz (86.807 kg)  06/03/15 188 lb 4 oz (85.39 kg)    Physical Exam  Constitutional: She appears well-developed and well-nourished. No distress.  Musculoskeletal: She exhibits no edema.  L knee WNL R Knee exam: No deformity on inspection. Mild discomfort with some swelling at anterior patella No effusion noted. FROM in flex/extension without crepitus. No  popliteal fullness. Neg drawer test. Neg mcmurray test. No pain with valgus/varus stress. No PFgrind. No abnormal patellar mobility.   Skin: Skin is warm and dry. No rash noted.  Abrasion R kneecap  Nursing note and vitals reviewed.  Results for orders placed or performed in visit on 05/27/15  Lipid Profile  Result Value Ref Range   Cholesterol 153 0 - 200 mg/dL   Triglycerides 44.0 0.0 - 149.0 mg/dL   HDL 60.70 >39.00 mg/dL   VLDL 8.8 0.0 - 40.0 mg/dL   LDL Cholesterol 84 0 - 99 mg/dL   Total CHOL/HDL Ratio 3    NonHDL 92.65   TSH  Result Value Ref Range   TSH 3.32 0.35 - 4.50 uIU/mL  CBC  Result Value Ref Range   WBC 4.0 4.0 - 10.5 K/uL   RBC 5.03 3.87 - 5.11 Mil/uL   Platelets 251.0 150.0 - 400.0 K/uL   Hemoglobin 14.0 12.0 - 15.0 g/dL   HCT 42.4 36.0 - 46.0 %   MCV 84.4 78.0 - 100.0 fl   MCHC 33.0 30.0 - 36.0 g/dL   RDW 15.3 11.5 - 15.5 %  Comp Met (CMET)  Result Value Ref Range   Sodium 142 135 - 145 mEq/L   Potassium 3.7 3.5 - 5.1 mEq/L   Chloride 108 96 - 112  mEq/L   CO2 26 19 - 32 mEq/L   Glucose, Bld 86 70 - 99 mg/dL   BUN 14 6 - 23 mg/dL   Creatinine, Ser 0.86 0.40 - 1.20 mg/dL   Total Bilirubin 0.9 0.2 - 1.2 mg/dL   Alkaline Phosphatase 75 39 - 117 U/L   AST 16 0 - 37 U/L   ALT 10 0 - 35 U/L   Total Protein 7.5 6.0 - 8.3 g/dL   Albumin 4.2 3.5 - 5.2 g/dL   Calcium 9.4 8.4 - 10.5 mg/dL   GFR 87.11 >60.00 mL/min      Assessment & Plan:   Problem List Items Addressed This Visit    Right knee injury - Primary    Reassured, anticipate patellar contusion should heal well with time. Supportive care discussed. Pt remains concerned about patellar fracture. Second visit for same reason. Will check xrays of knee to r/o fracture. Pt agrees with plan. Will go to [facility] for [facility] today.      Relevant Orders   DG Knee AP/LAT W/Sunrise Right       Follow up plan: No Follow-up on file.  Ria Bush, MD

## 2015-10-30 NOTE — Progress Notes (Signed)
Pre-visit discussion using our clinic review tool. No additional management support is needed unless otherwise documented below in the visit note.  

## 2015-10-30 NOTE — Assessment & Plan Note (Signed)
Reassured, anticipate patellar contusion should heal well with time. Supportive care discussed. Pt remains concerned about patellar fracture. Second visit for same reason. Will check xrays of knee to r/o fracture. Pt agrees with plan. [facility]

## 2016-06-18 ENCOUNTER — Other Ambulatory Visit: Payer: Self-pay | Admitting: Family Medicine

## 2016-07-25 ENCOUNTER — Other Ambulatory Visit: Payer: Self-pay | Admitting: Family Medicine

## 2016-08-23 ENCOUNTER — Other Ambulatory Visit: Payer: Self-pay | Admitting: Family Medicine

## 2016-08-28 ENCOUNTER — Encounter: Payer: Self-pay | Admitting: Family Medicine

## 2016-08-28 ENCOUNTER — Telehealth: Payer: Self-pay | Admitting: Family Medicine

## 2016-08-28 MED ORDER — CARVEDILOL 6.25 MG PO TABS: 6.2500 mg | ORAL_TABLET | Freq: Two times a day (BID) | ORAL | 0 refills | Status: DC

## 2016-08-28 MED ORDER — AMLODIPINE BESYLATE 10 MG PO TABS: 10.0000 mg | ORAL_TABLET | Freq: Every day | ORAL | 0 refills | Status: DC

## 2016-08-28 NOTE — Telephone Encounter (Signed)
Called patient unable to leave voicemail.  Medications sent to pt phaarmacy.  PC

## 2016-08-28 NOTE — Telephone Encounter (Signed)
Caller name: Edwyna ShellLoretta [name] Relationship to patient: self Can be reached: [phone] Pharmacy: [facility] [phone] - [place], [place] - [address] [facility] [address]  Reason for call: Pt called stating she needs carvedilol and amlodipine refilled. She is out. I scheduled pt for f/u visit 10/05/16 with Dr. Abner GreenspanBlyth. Pt declined earlier visit.

## 2016-09-24 ENCOUNTER — Other Ambulatory Visit: Payer: Self-pay | Admitting: Family Medicine

## 2016-10-05 ENCOUNTER — Ambulatory Visit (INDEPENDENT_AMBULATORY_CARE_PROVIDER_SITE_OTHER): Payer: BLUE CROSS/BLUE SHIELD | Admitting: Family Medicine

## 2016-10-05 ENCOUNTER — Encounter: Payer: Self-pay | Admitting: Family Medicine

## 2016-10-05 VITALS — BP 126/76 | HR 92 | Temp 97.8°F | Resp 16 | Ht 68.0 in | Wt 176.4 lb

## 2016-10-05 DIAGNOSIS — I10 Essential (primary) hypertension: Secondary | ICD-10-CM | POA: Diagnosis not present

## 2016-10-05 DIAGNOSIS — K635 Polyp of colon: Secondary | ICD-10-CM

## 2016-10-05 DIAGNOSIS — Z1239 Encounter for other screening for malignant neoplasm of breast: Secondary | ICD-10-CM

## 2016-10-05 DIAGNOSIS — E663 Overweight: Secondary | ICD-10-CM

## 2016-10-05 DIAGNOSIS — Z1231 Encounter for screening mammogram for malignant neoplasm of breast: Secondary | ICD-10-CM

## 2016-10-05 DIAGNOSIS — E782 Mixed hyperlipidemia: Secondary | ICD-10-CM | POA: Diagnosis not present

## 2016-10-05 MED ORDER — AMLODIPINE BESYLATE 10 MG PO TABS: ORAL_TABLET | ORAL | 1 refills | Status: DC

## 2016-10-05 MED ORDER — CARVEDILOL 6.25 MG PO TABS: 6.2500 mg | ORAL_TABLET | Freq: Two times a day (BID) | ORAL | 1 refills | Status: DC

## 2016-10-05 NOTE — Assessment & Plan Note (Signed)
Encouraged DASH diet, decrease po intake and increase exercise as tolerated. Needs 7-8 hours of sleep nightly. Avoid trans fats, eat small, frequent meals every 4-5 hours with lean proteins, complex carbs and healthy fats. Minimize simple carbs 

## 2016-10-05 NOTE — Patient Instructions (Signed)

## 2016-10-05 NOTE — Assessment & Plan Note (Signed)
Well controlled, no changes to meds. Encouraged heart healthy diet such as the DASH diet and exercise as tolerated.  °

## 2016-10-05 NOTE — Progress Notes (Signed)
Patient ID: Amber [name], female   DOB: [birth year], 61 y.o.   MRN: [id number]     Subjective:  I acted as a Neurosurgeon for Dr. Abner Greenspan.  Apolonio Schneiders, CMA.   Patient ID: Amber [name], female    DOB: [birth year], 61 y.o.   MRN: [id number]  Chief Complaint  Patient presents with  . Hypertension  . Hyperlipidemia    HPI  Patient is in today for follow up blood pressure and cholesterol.  It has been a while since she has been in. She is feeling well today, needs refills on her medications. No recent febrile illness or hospitalizations. Denies CP/palp/SOB/HA/congestion/fevers/GI or GU c/o. Taking meds as prescribed  Patient Care Team: Bradd Canary, MD as PCP - General (Family Medicine)   Past Medical History:  Diagnosis Date  . Allergic state 09/19/2013  . Angioedema   . Chicken pox   . Epistaxis 01/15/2013  . Hyperlipidemia 10/08/2016  . Hypertension   . Leukopenia 12/06/2014  . Measles   . Mumps   . Overweight 08/15/2007   Qualifier: Diagnosis of  By: Nena Jordan     No past surgical history on file.  Family History  Problem Relation Age of Onset  . Diabetes Mother 85  . Kidney disease Mother   . Heart disease Mother   . Cancer Father     pancreatic  . Hypertension Brother   . Diabetes Brother   . Hypertension Sister   . Diabetes Paternal Grandmother   . Stroke Paternal Grandfather   . Hypertension Sister   . Hypertension Sister   . Hypertension Sister   . Hypertension Other     multiple    Social History   Social History  . Marital status: Married    Spouse name: N/A  . Number of children: N/A  . Years of education: N/A   Occupational History  . Not on file.   Social History Main Topics  . Smoking status: Never Smoker  . Smokeless tobacco: Never Used  . Alcohol use No  . Drug use: No  . Sexual activity: No     Comment: no dietary restrictions, lives alone, insurance work   Other Topics Concern  . Not on file   Social History Narrative  . No  narrative on file    Outpatient Medications Prior to Visit  Medication Sig Dispense Refill  . Multiple Vitamins-Minerals (MEGA MULTIVITAMIN FOR WOMEN PO) Take 1 tablet by mouth daily. Reported on 10/20/2015    . amLODipine (NORVASC) 10 MG tablet TAKE 1 TABLET(10 MG) BY MOUTH DAILY 30 tablet 0  . carvedilol (COREG) 6.25 MG tablet Take 1 tablet (6.25 mg total) by mouth 2 (two) times daily with a meal. 60 tablet 0   No facility-administered medications prior to visit.     No Known Allergies  Review of Systems  Constitutional: Negative for fever and malaise/fatigue.  HENT: Negative for congestion.   Eyes: Negative for blurred vision.  Respiratory: Negative for cough and shortness of breath.   Cardiovascular: Negative for chest pain, palpitations and leg swelling.  Gastrointestinal: Negative for vomiting.  Musculoskeletal: Negative for back pain.  Skin: Negative for rash.  Neurological: Negative for loss of consciousness and headaches.       Objective:    Physical Exam  Constitutional: She is oriented to person, place, and time. She appears well-developed and well-nourished. No distress.  HENT:  Head: Normocephalic and atraumatic.  Eyes: Conjunctivae are normal.  Neck: Normal range of  motion. No thyromegaly present.  Cardiovascular: Normal rate and regular rhythm.   Pulmonary/Chest: Effort normal and breath sounds normal. She has no wheezes.  Abdominal: Soft. Bowel sounds are normal. There is no tenderness.  Musculoskeletal: Normal range of motion. She exhibits no edema or deformity.  Neurological: She is alert and oriented to person, place, and time.  Skin: Skin is warm and dry. She is not diaphoretic.  Psychiatric: She has a normal mood and affect.    BP 126/76 (BP Location: Left Arm, Cuff Size: Normal)   Pulse 92   Temp 97.8 F (36.6 C) (Oral)   Resp 16   Ht  (1.727 m)   Wt 176 lb 6.4 oz (80 kg)   SpO2 98%   BMI 26.82 kg/m  Wt Readings from Last 3 Encounters:    10/05/16 176 lb 6.4 oz (80 kg)  10/30/15 190 lb 12 oz (86.5 kg)  10/20/15 191 lb 6 oz (86.8 kg)   BP Readings from Last 3 Encounters:  10/05/16 126/76  10/30/15 120/80  10/20/15 118/80     There is no immunization history for the selected administration types on file for this patient.  Health Maintenance  Topic Date Due  . Hepatitis C Screening  07-10-1955  . HIV Screening  03/28/1972  . TETANUS/TDAP  03/28/1976  . PAP SMEAR  06/02/2013  . MAMMOGRAM  11/17/2013  . INFLUENZA VACCINE  01/17/2017  . COLONOSCOPY  05/07/2019    Lab Results  Component Value Date   WBC 5.0 10/05/2016   HGB 12.9 10/05/2016   HCT 39.5 10/05/2016   PLT 272.0 10/05/2016   GLUCOSE 87 10/05/2016   CHOL 203 (H) 10/05/2016   TRIG 62.0 10/05/2016   HDL 74.80 10/05/2016   LDLCALC 116 (H) 10/05/2016   ALT 11 10/05/2016   AST 18 10/05/2016   NA 140 10/05/2016   K 3.7 10/05/2016   CL 109 10/05/2016   CREATININE 1.00 10/05/2016   BUN 17 10/05/2016   CO2 25 10/05/2016   TSH 1.30 10/05/2016    Lab Results  Component Value Date   TSH 1.30 10/05/2016   Lab Results  Component Value Date   WBC 5.0 10/05/2016   HGB 12.9 10/05/2016   HCT 39.5 10/05/2016   MCV 84.8 10/05/2016   PLT 272.0 10/05/2016   Lab Results  Component Value Date   NA 140 10/05/2016   K 3.7 10/05/2016   CO2 25 10/05/2016   GLUCOSE 87 10/05/2016   BUN 17 10/05/2016   CREATININE 1.00 10/05/2016   BILITOT 0.8 10/05/2016   ALKPHOS 63 10/05/2016   AST 18 10/05/2016   ALT 11 10/05/2016   PROT 7.5 10/05/2016   ALBUMIN 4.1 10/05/2016   CALCIUM 9.4 10/05/2016   GFR 72.85 10/05/2016   Lab Results  Component Value Date   CHOL 203 (H) 10/05/2016   Lab Results  Component Value Date   HDL 74.80 10/05/2016   Lab Results  Component Value Date   LDLCALC 116 (H) 10/05/2016   Lab Results  Component Value Date   TRIG 62.0 10/05/2016   Lab Results  Component Value Date   CHOLHDL 3 10/05/2016   No results found for:  HGBA1C       Assessment & Plan:   Problem List Items Addressed This Visit    Essential hypertension - Primary    Well controlled, no changes to meds. Encouraged heart healthy diet such as the DASH diet and exercise as tolerated.  Relevant Medications   amLODipine (NORVASC) 10 MG tablet   carvedilol (COREG) 6.25 MG tablet   Other Relevant Orders   POCT Urinalysis Dipstick (Automated)   CBC (Completed)   Comprehensive metabolic panel (Completed)   Lipid panel (Completed)   TSH (Completed)   Overweight    Encouraged DASH diet, decrease po intake and increase exercise as tolerated. Needs 7-8 hours of sleep nightly. Avoid trans fats, eat small, frequent meals every 4-5 hours with lean proteins, complex carbs and healthy fats. Minimize simple carbs      Hyperlipidemia    Encouraged heart healthy diet, increase exercise, avoid trans fats, consider a krill oil cap daily      Relevant Medications   amLODipine (NORVASC) 10 MG tablet   carvedilol (COREG) 6.25 MG tablet    Other Visit Diagnoses    Breast cancer screening       Relevant Orders   MM Digital Screening   Polyp of colon, unspecified part of colon, unspecified type       Relevant Orders   Ambulatory referral to Gastroenterology      I am having Ms. Lanae Boast maintain her Multiple Vitamins-Minerals (MEGA MULTIVITAMIN FOR WOMEN PO), amLODipine, and carvedilol.  Meds ordered this encounter  Medications  . amLODipine (NORVASC) 10 MG tablet    Sig: TAKE 1 TABLET(10 MG) BY MOUTH DAILY    Dispense:  90 tablet    Refill:  1  . carvedilol (COREG) 6.25 MG tablet    Sig: Take 1 tablet (6.25 mg total) by mouth 2 (two) times daily with a meal.    Dispense:  180 tablet    Refill:  1    CMA served as scribe during this visit. History, Physical and Plan performed by medical provider. Documentation and orders reviewed and attested to.  Danise Edge, MD

## 2016-10-05 NOTE — Progress Notes (Signed)
Pre visit review using our clinic review tool, if applicable. No additional management support is needed unless otherwise documented below in the visit note. 

## 2016-10-06 ENCOUNTER — Encounter: Payer: Self-pay | Admitting: Gastroenterology

## 2016-10-06 LAB — LIPID PANEL
CHOL/HDL RATIO: 3
Cholesterol: 203 mg/dL — ABNORMAL HIGH (ref 0–200)
HDL: 74.8 mg/dL (ref >39.00)
LDL Cholesterol: 116 mg/dL — ABNORMAL HIGH (ref 0–99)
NONHDL: 128.07
TRIGLYCERIDES: 62 mg/dL (ref 0.0–149.0)
VLDL: 12.4 mg/dL (ref 0.0–40.0)

## 2016-10-06 LAB — COMPREHENSIVE METABOLIC PANEL
ALK PHOS: 63 U/L (ref 39–117)
ALT: 11 U/L (ref 0–35)
AST: 18 U/L (ref 0–37)
Albumin: 4.1 g/dL (ref 3.5–5.2)
BUN: 17 mg/dL (ref 6–23)
CO2: 25 meq/L (ref 19–32)
Calcium: 9.4 mg/dL (ref 8.4–10.5)
Chloride: 109 mEq/L (ref 96–112)
Creatinine, Ser: 1 mg/dL (ref 0.40–1.20)
GFR: 72.85 mL/min (ref >60.00)
GLUCOSE: 87 mg/dL (ref 70–99)
POTASSIUM: 3.7 meq/L (ref 3.5–5.1)
Sodium: 140 mEq/L (ref 135–145)
Total Bilirubin: 0.8 mg/dL (ref 0.2–1.2)
Total Protein: 7.5 g/dL (ref 6.0–8.3)

## 2016-10-06 LAB — TSH: TSH: 1.3 u[IU]/mL (ref 0.35–4.50)

## 2016-10-06 LAB — CBC
HCT: 39.5 % (ref 36.0–46.0)
HEMOGLOBIN: 12.9 g/dL (ref 12.0–15.0)
MCHC: 32.7 g/dL (ref 30.0–36.0)
MCV: 84.8 fl (ref 78.0–100.0)
Platelets: 272 10*3/uL (ref 150.0–400.0)
RBC: 4.65 Mil/uL (ref 3.87–5.11)
RDW: 14.9 % (ref 11.5–15.5)
WBC: 5 10*3/uL (ref 4.0–10.5)

## 2016-10-08 ENCOUNTER — Encounter: Payer: Self-pay | Admitting: Family Medicine

## 2016-10-08 DIAGNOSIS — E785 Hyperlipidemia, unspecified: Secondary | ICD-10-CM

## 2016-10-08 HISTORY — DX: Hyperlipidemia, unspecified: E78.5

## 2016-10-08 NOTE — Assessment & Plan Note (Signed)
Encouraged heart healthy diet, increase exercise, avoid trans fats, consider a krill oil cap daily 

## 2016-10-09 LAB — POC URINALSYSI DIPSTICK (AUTOMATED)
BILIRUBIN UA: NEGATIVE
GLUCOSE UA: NEGATIVE
Ketones, UA: NEGATIVE
Leukocytes, UA: NEGATIVE
Nitrite, UA: NEGATIVE
Protein, UA: NEGATIVE
RBC UA: NEGATIVE
Spec Grav, UA: 1.025 (ref 1.010–1.025)
UROBILINOGEN UA: 0.2 U/dL
pH, UA: 6 (ref 5.0–8.0)

## 2016-11-29 ENCOUNTER — Ambulatory Visit (AMBULATORY_SURGERY_CENTER): Payer: Self-pay

## 2016-11-29 VITALS — Ht 69.6 in | Wt 178.2 lb

## 2016-11-29 DIAGNOSIS — Z1211 Encounter for screening for malignant neoplasm of colon: Secondary | ICD-10-CM

## 2016-11-29 MED ORDER — NA SULFATE-K SULFATE-MG SULF 17.5-3.13-1.6 GM/177ML PO SOLN: 1.0000 | Freq: Once | ORAL | 0 refills | Status: AC

## 2016-11-29 NOTE — Progress Notes (Signed)
Denies allergies to eggs or soy products. Denies complication of anesthesia or sedation. Denies use of weight loss medication. Denies use of O2.   Emmi instructions given for colonoscopy.  

## 2016-11-30 ENCOUNTER — Encounter: Payer: Self-pay | Admitting: Gastroenterology

## 2016-12-13 ENCOUNTER — Encounter: Payer: BLUE CROSS/BLUE SHIELD | Admitting: Gastroenterology

## 2016-12-21 DIAGNOSIS — Z1211 Encounter for screening for malignant neoplasm of colon: Secondary | ICD-10-CM | POA: Diagnosis not present

## 2016-12-21 DIAGNOSIS — Z86010 Personal history of colonic polyps: Secondary | ICD-10-CM | POA: Diagnosis not present

## 2016-12-21 LAB — HM COLONOSCOPY

## 2017-04-25 ENCOUNTER — Other Ambulatory Visit: Payer: Self-pay | Admitting: Family Medicine

## 2017-04-26 NOTE — Telephone Encounter (Signed)
Pt is due for follow up please call and schedule appointment.  

## 2017-04-28 NOTE — Telephone Encounter (Signed)
LVM advising patient to call and schedule follow up appointment.

## 2017-05-26 IMAGING — CR DG KNEE AP/LAT W/ SUNRISE*R*
3 series · 3 of 3 positions shown · non-contrast
Comparison: None.

CLINICAL DATA: Knee injury 2 weeks ago.  Persistent patellar pain

EXAM:
RIGHT KNEE 3 VIEWS

[t knee ap right]
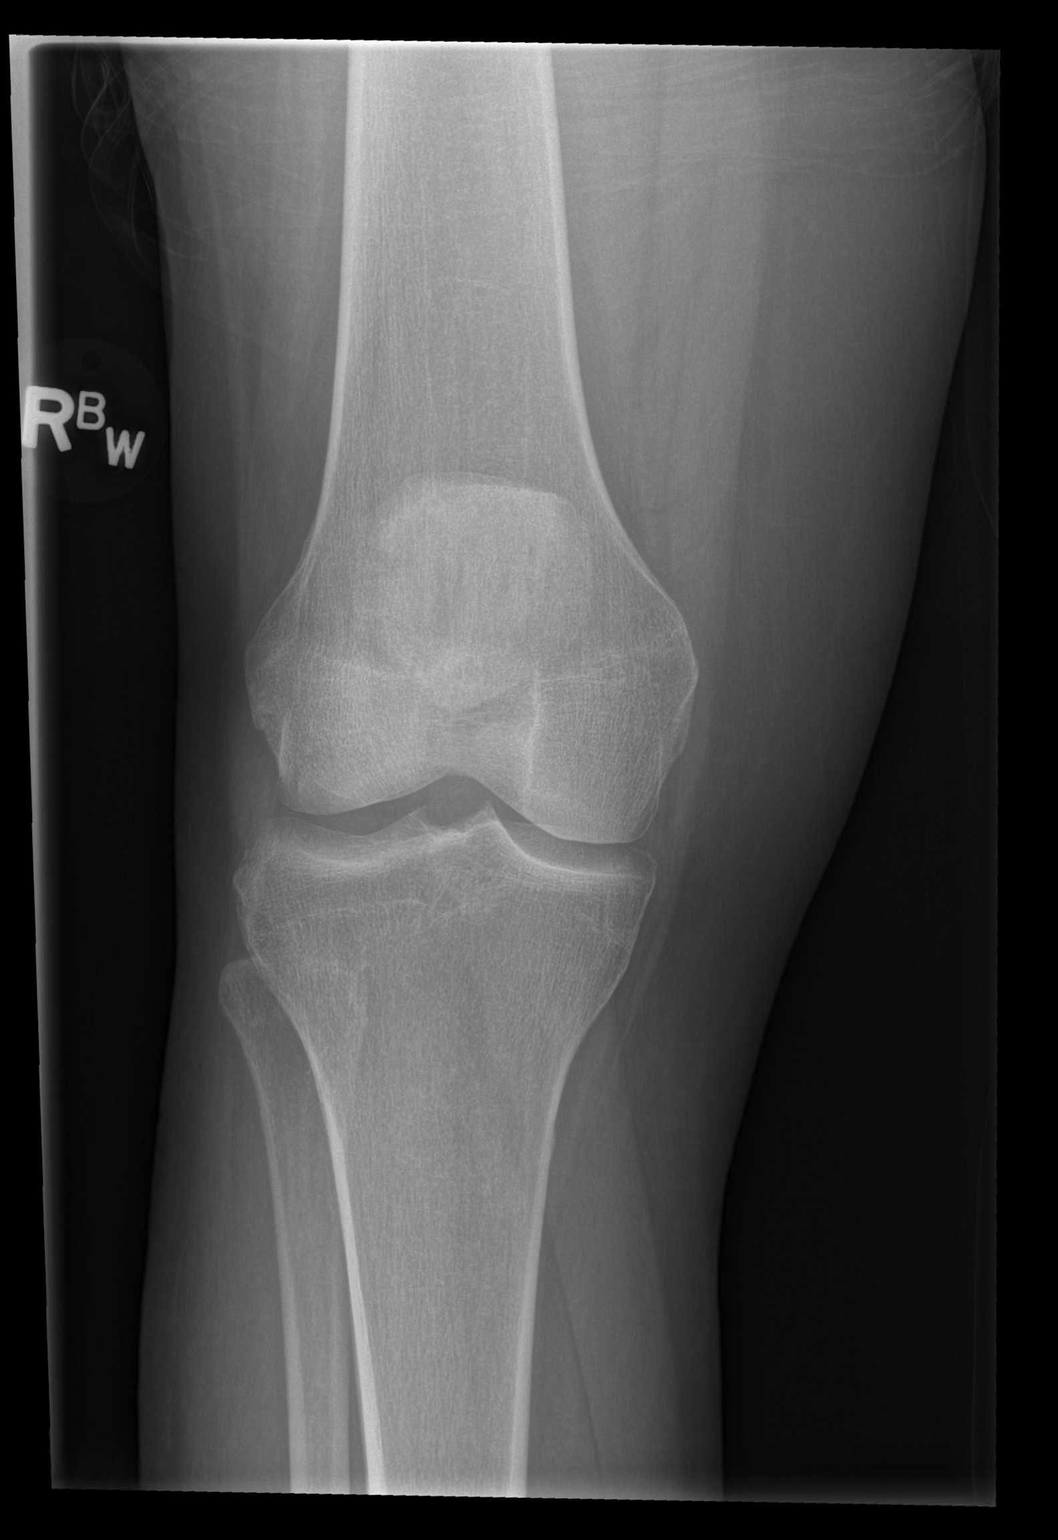

[x knee ap right]
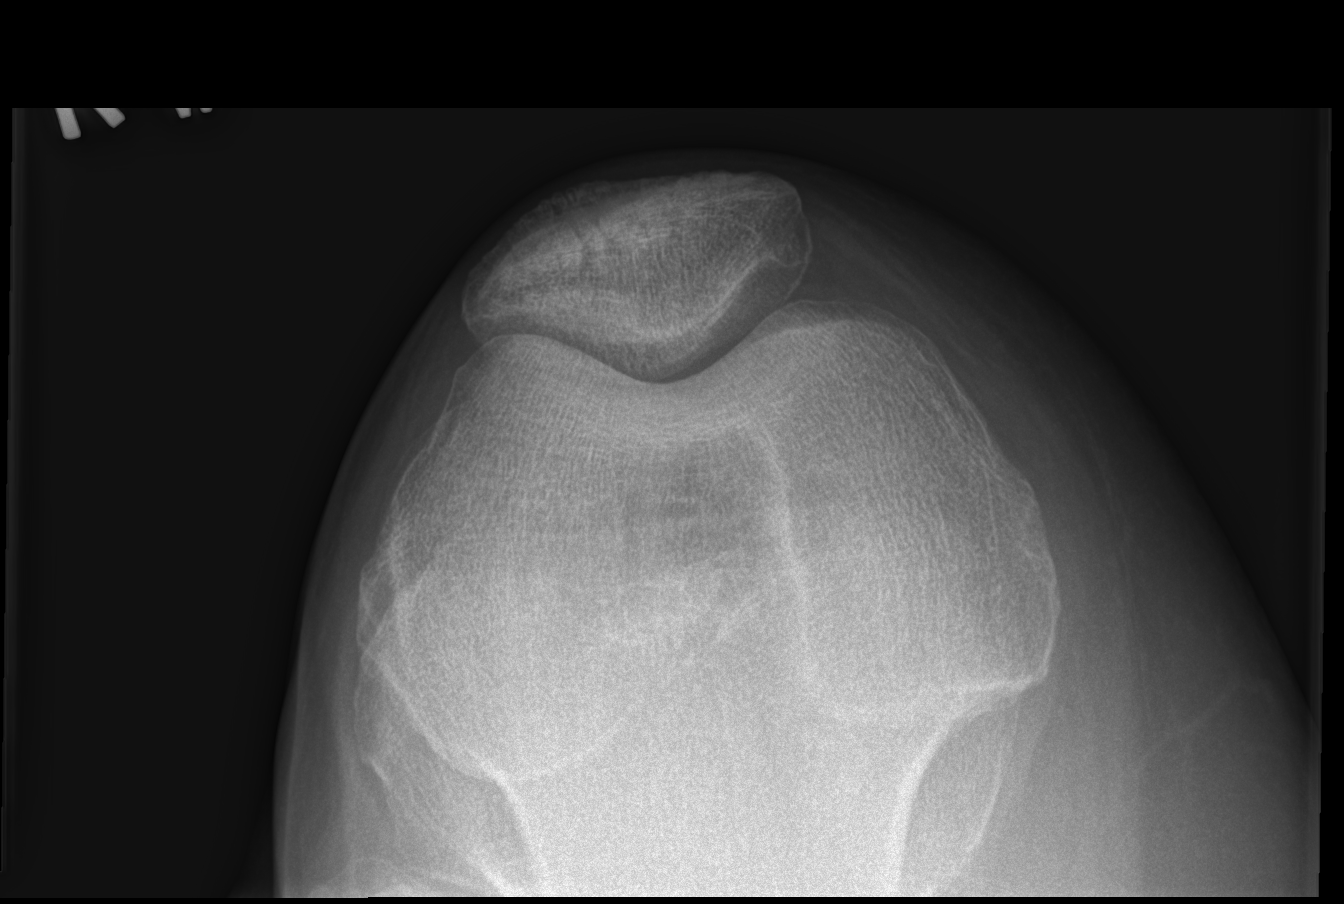

[t knee lat right]
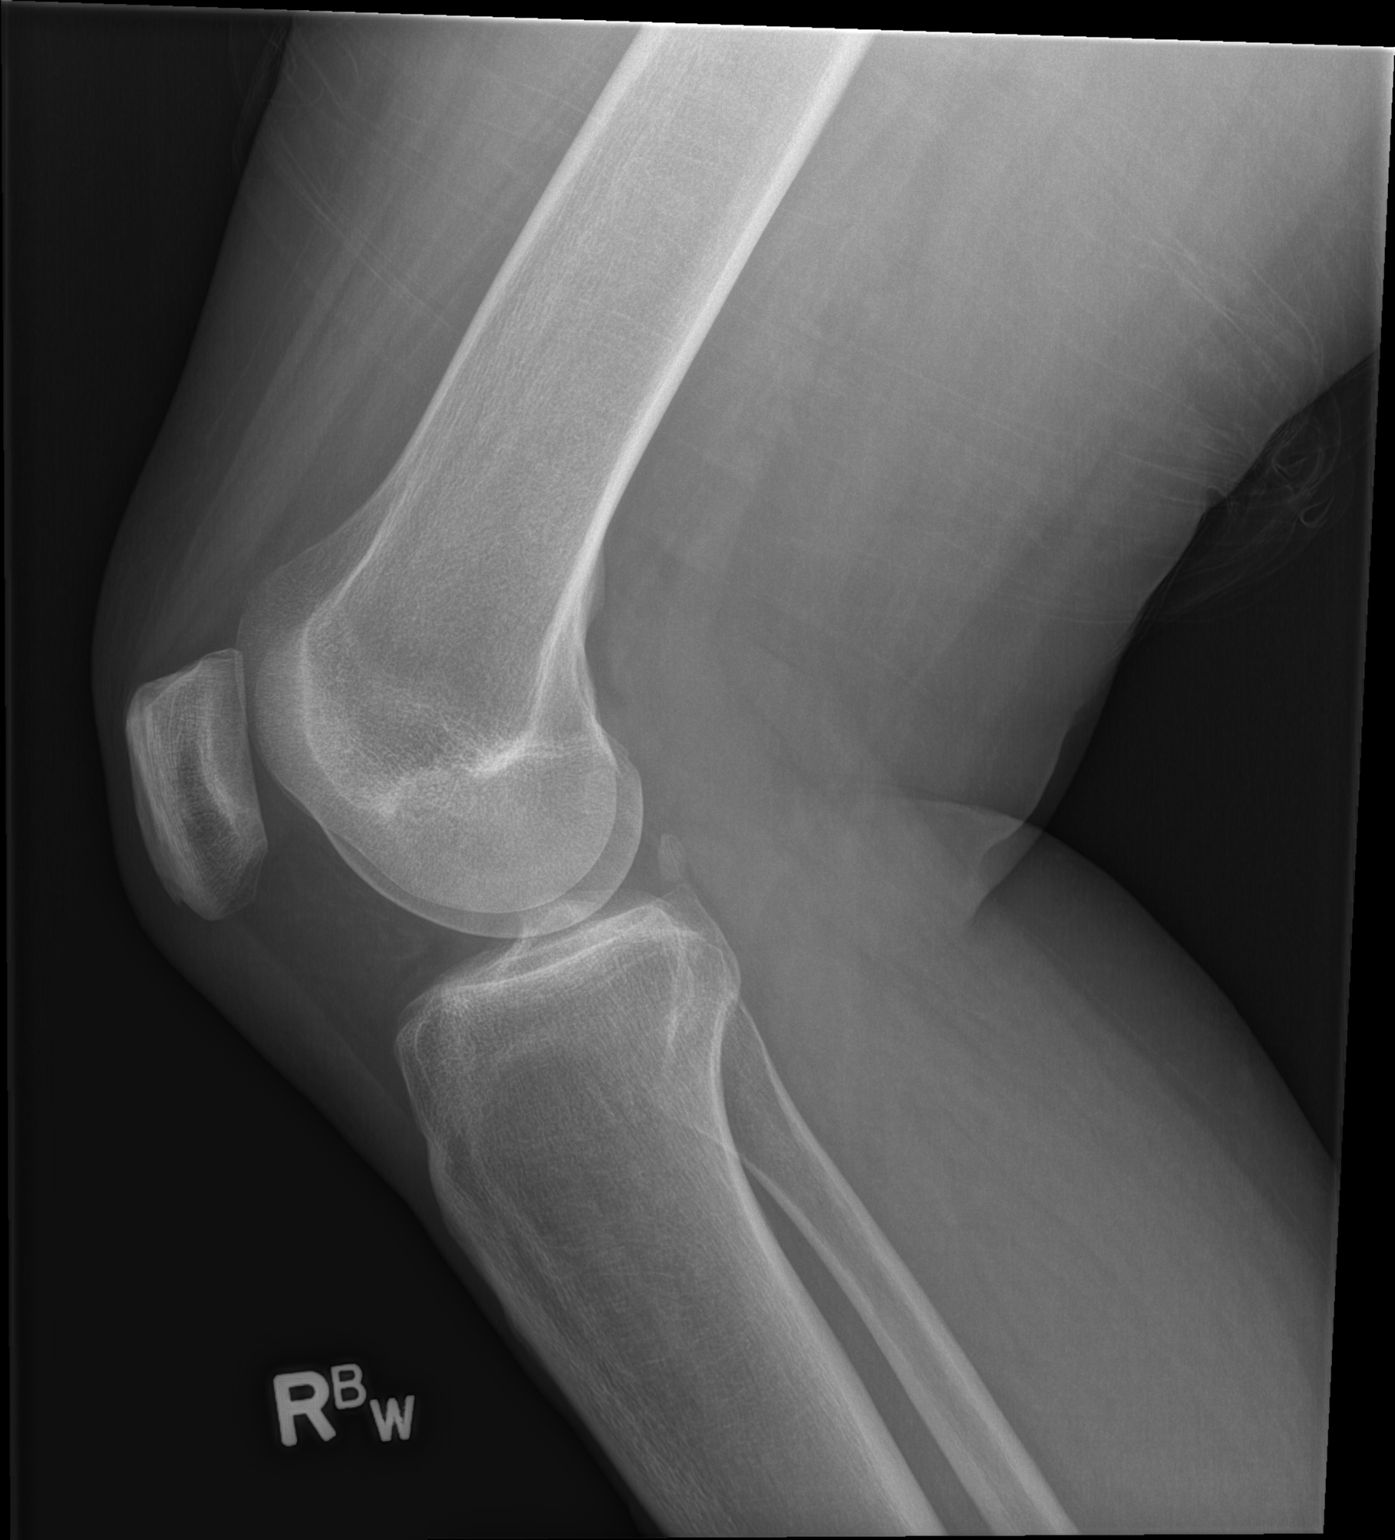

[3 of 3 positions shown; findings below may reference images not displayed]

FINDINGS: There is no evidence of fracture, dislocation, or joint effusion.
There is no evidence of arthropathy or other focal bone abnormality.
Soft tissues are unremarkable.
IMPRESSION: Negative.

## 2017-06-18 ENCOUNTER — Ambulatory Visit: Payer: BLUE CROSS/BLUE SHIELD | Admitting: Family Medicine

## 2017-07-05 ENCOUNTER — Ambulatory Visit: Payer: BLUE CROSS/BLUE SHIELD | Admitting: Family Medicine

## 2017-07-05 ENCOUNTER — Encounter: Payer: Self-pay | Admitting: Family Medicine

## 2017-07-05 DIAGNOSIS — E782 Mixed hyperlipidemia: Secondary | ICD-10-CM

## 2017-07-05 DIAGNOSIS — I10 Essential (primary) hypertension: Secondary | ICD-10-CM

## 2017-07-05 DIAGNOSIS — D72819 Decreased white blood cell count, unspecified: Secondary | ICD-10-CM

## 2017-07-05 NOTE — Assessment & Plan Note (Signed)
Encouraged heart healthy diet, increase exercise, avoid trans fats, consider a krill oil cap daily 

## 2017-07-05 NOTE — Progress Notes (Signed)
Subjective:  I acted as a Neurosurgeonscribe for Amber IncDr.Blyth. Amber [name], RMA   Patient ID: Amber SleightLoretta M [name], female    DOB: October 06, 1955, 62 y.o.   MRN: [id number]  Chief Complaint  Patient presents with  . Follow-up    HPI  Patient is in today for 6 month follow up visit and over all she feels well. No recent febrile illness or hospitalizations. Is trying to stay active and maintain a heart healthy diet. Denies CP/palp/SOB/HA/congestion/fevers/GI or GU c/o. Taking meds as prescribed  Patient Care Team: Bradd CanaryBlyth, Stacey A, MD as PCP - General (Family Medicine)   Past Medical History:  Diagnosis Date  . Allergic state 09/19/2013  . Allergy   . Angioedema   . Chicken pox   . Epistaxis 01/15/2013  . Hyperlipidemia 10/08/2016  . Hypertension   . Leukopenia 12/06/2014  . Measles   . Mumps   . Overweight 08/15/2007   Qualifier: Diagnosis of  By: Nena JordanYoo DO, D. Robert     History reviewed. No pertinent surgical history.  Family History  Problem Relation Age of Onset  . Diabetes Mother 8478  . Kidney disease Mother   . Heart disease Mother   . Cancer Father        pancreatic  . Pancreatic cancer Father   . Hypertension Brother   . Diabetes Brother   . Hypertension Sister   . Diabetes Paternal Grandmother   . Stroke Paternal Grandfather   . Hypertension Sister   . Hypertension Sister   . Hypertension Sister   . Hypertension Other        multiple  . Colon cancer Neg Hx   . Esophageal cancer Neg Hx   . Rectal cancer Neg Hx   . Stomach cancer Neg Hx     Social History   Socioeconomic History  . Marital status: Married    Spouse name: Not on file  . Number of children: Not on file  . Years of education: Not on file  . Highest education level: Not on file  Social Needs  . Financial resource strain: Not on file  . Food insecurity - worry: Not on file  . Food insecurity - inability: Not on file  . Transportation needs - medical: Not on file  . Transportation needs - non-medical: Not on file    Occupational History  . Not on file  Tobacco Use  . Smoking status: Never Smoker  . Smokeless tobacco: Never Used  Substance and Sexual Activity  . Alcohol use: No  . Drug use: No  . 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 Not on file  Social History Narrative  . Not on file    Outpatient Medications Prior to Visit  Medication Sig Dispense Refill  . amLODipine (NORVASC) 10 MG tablet TAKE 1 TABLET(10 MG) BY MOUTH DAILY 90 tablet 0  . carvedilol (COREG) 6.25 MG tablet Take 1 tablet (6.25 mg total) by mouth 2 (two) times daily with a meal. 180 tablet 1  . Multiple Vitamins-Minerals (MEGA MULTIVITAMIN FOR WOMEN PO) Take 1 tablet by mouth daily. Reported on 10/20/2015    . Omega-3 Fatty Acids (FISH OIL) 1000 MG CAPS Take 1 capsule by mouth daily.     No facility-administered medications prior to visit.     No Known Allergies  Review of Systems  Constitutional: Negative for fever and malaise/fatigue.  HENT: Negative for congestion.   Eyes: Negative for blurred  vision.  Respiratory: Negative for shortness of breath.   Cardiovascular: Negative for chest pain, palpitations and leg swelling.  Gastrointestinal: Negative for abdominal pain, blood in stool and nausea.  Genitourinary: Negative for dysuria and frequency.  Musculoskeletal: Negative for falls.  Skin: Negative for rash.  Neurological: Negative for dizziness, loss of consciousness and headaches.  Endo/Heme/Allergies: Negative for environmental allergies.  Psychiatric/Behavioral: Negative for depression. The patient is not nervous/anxious.        Objective:    Physical Exam  Constitutional: She is oriented to person, place, and time. She appears well-developed and well-nourished. No distress.  HENT:  Head: Normocephalic and atraumatic.  Nose: Nose normal.  Eyes: Right eye exhibits no discharge. Left eye exhibits no discharge.  Neck: Normal range of  motion. Neck supple.  Cardiovascular: Normal rate and regular rhythm.  No murmur heard. Pulmonary/Chest: Effort normal and breath sounds normal.  Abdominal: Soft. Bowel sounds are normal. There is no tenderness.  Musculoskeletal: She exhibits no edema.  Neurological: She is alert and oriented to person, place, and time.  Skin: Skin is warm and dry.  Psychiatric: She has a normal mood and affect.  Nursing note and vitals reviewed.   BP 132/83 (BP Location: Left Arm, Patient Position: Sitting, Cuff Size: Normal)   Pulse 93   Temp 98.2 F (36.8 C) (Oral)   Resp 16   Ht 5' 9.69" (1.77 m)   Wt 178 lb 6.4 oz (80.9 kg)   SpO2 100%   BMI 25.83 kg/m  Wt Readings from Last 3 Encounters:  07/05/17 178 lb 6.4 oz (80.9 kg)  11/29/16 178 lb 3.2 oz (80.8 kg)  10/05/16 176 lb 6.4 oz (80 kg)   BP Readings from Last 3 Encounters:  07/05/17 132/83  10/05/16 126/76  10/30/15 120/80     There is no immunization history for the selected administration types on file for this patient.  Health Maintenance  Topic Date Due  . Hepatitis C Screening  Aug 06, 1955  . HIV Screening  03/28/1972  . TETANUS/TDAP  03/28/1976  . PAP SMEAR  06/02/2013  . MAMMOGRAM  11/17/2013  . INFLUENZA VACCINE  03/07/2018 (Originally 01/17/2017)  . COLONOSCOPY  05/07/2019    Lab Results  Component Value Date   WBC 5.0 10/05/2016   HGB 12.9 10/05/2016   HCT 39.5 10/05/2016   PLT 272.0 10/05/2016   GLUCOSE 87 10/05/2016   CHOL 203 (H) 10/05/2016   TRIG 62.0 10/05/2016   HDL 74.80 10/05/2016   LDLCALC 116 (H) 10/05/2016   ALT 11 10/05/2016   AST 18 10/05/2016   NA 140 10/05/2016   K 3.7 10/05/2016   CL 109 10/05/2016   CREATININE 1.00 10/05/2016   BUN 17 10/05/2016   CO2 25 10/05/2016   TSH 1.30 10/05/2016    Lab Results  Component Value Date   TSH 1.30 10/05/2016   Lab Results  Component Value Date   WBC 5.0 10/05/2016   HGB 12.9 10/05/2016   HCT 39.5 10/05/2016   MCV 84.8 10/05/2016   PLT 272.0  10/05/2016   Lab Results  Component Value Date   NA 140 10/05/2016   K 3.7 10/05/2016   CO2 25 10/05/2016   GLUCOSE 87 10/05/2016   BUN 17 10/05/2016   CREATININE 1.00 10/05/2016   BILITOT 0.8 10/05/2016   ALKPHOS 63 10/05/2016   AST 18 10/05/2016   ALT 11 10/05/2016   PROT 7.5 10/05/2016   ALBUMIN 4.1 10/05/2016   CALCIUM 9.4 10/05/2016   GFR  72.85 10/05/2016   Lab Results  Component Value Date   CHOL 203 (H) 10/05/2016   Lab Results  Component Value Date   HDL 74.80 10/05/2016   Lab Results  Component Value Date   LDLCALC 116 (H) 10/05/2016   Lab Results  Component Value Date   TRIG 62.0 10/05/2016   Lab Results  Component Value Date   CHOLHDL 3 10/05/2016   No results found for: HGBA1C       Assessment & Plan:   Problem List Items Addressed This Visit    Essential hypertension    Well controlled, no changes to meds. Encouraged heart healthy diet such as the DASH diet and exercise as tolerated.       Relevant Orders   CBC   Comprehensive metabolic panel   TSH   Leukopenia    Check labs      Hyperlipidemia    Encouraged heart healthy diet, increase exercise, avoid trans fats, consider a krill oil cap daily      Relevant Orders   Lipid panel      I am having Amber [name] maintain her Multiple Vitamins-Minerals (MEGA MULTIVITAMIN FOR WOMEN PO), carvedilol, Fish Oil, and amLODipine.  No orders of the defined types were placed in this encounter.   CMA served as Neurosurgeon during this visit. History, Physical and Plan performed by medical provider. Documentation and orders reviewed and attested to.  Danise Edge, MD

## 2017-07-05 NOTE — Assessment & Plan Note (Signed)
Check labs 

## 2017-07-05 NOTE — Assessment & Plan Note (Signed)
Well controlled, no changes to meds. Encouraged heart healthy diet such as the DASH diet and exercise as tolerated.  °

## 2017-07-05 NOTE — Patient Instructions (Addendum)
MIND diet   [facility]  Cholesterol Cholesterol is a white, waxy, fat-like substance that is needed by the human body in small amounts. The liver makes all the cholesterol we need. Cholesterol is carried from the liver by the blood through the blood vessels. Deposits of cholesterol (plaques) may build up on blood vessel (artery) walls. Plaques make the arteries narrower and stiffer. Cholesterol plaques increase the risk for heart attack and stroke. You cannot feel your cholesterol level even if it is very high. The only way to know that it is high is to have a blood test. Once you know your cholesterol levels, you should keep a record of the test results. Work with your health care provider to keep your levels in the desired range. What do the results mean?  Total cholesterol is a rough measure of all the cholesterol in your blood.  LDL (low-density lipoprotein) is the "bad" cholesterol. This is the type that causes plaque to build up on the artery walls. You want this level to be low.  HDL (high-density lipoprotein) is the "good" cholesterol because it cleans the arteries and carries the LDL away. You want this level to be high.  Triglycerides are fat that the body can either burn for energy or store. High levels are closely linked to heart disease. What are the desired levels of cholesterol?  Total cholesterol below 200.  LDL below 100 for people who are at risk, below 70 for people at very high risk.  HDL above 40 is good. A level of 60 or higher is considered to be protective against heart disease.  Triglycerides below 150. How can I lower my cholesterol? Diet Follow your diet program as told by your health care provider.  Choose fish or white meat chicken and [place], roasted or baked. Limit fatty cuts of red meat, fried foods, and processed meats, such as sausage and lunch meats.  Eat lots of fresh fruits and vegetables.  Choose whole grains, beans, pasta,  potatoes, and cereals.  Choose olive oil, corn oil, or canola oil, and use only small amounts.  Avoid butter, mayonnaise, shortening, or palm kernel oils.  Avoid foods with trans fats.  Drink skim or nonfat milk and eat low-fat or nonfat yogurt and cheeses. Avoid whole milk, cream, ice cream, egg yolks, and full-fat cheeses.  Healthier desserts include angel food cake, ginger snaps, animal crackers, hard candy, popsicles, and low-fat or nonfat frozen yogurt. Avoid pastries, cakes, pies, and cookies.  Exercise  Follow your exercise program as told by your health care provider. A regular program: ? Helps to decrease LDL and raise HDL. ? Helps with weight control.  Do things that increase your activity level, such as gardening, walking, and taking the stairs.  Ask your health care provider about ways that you can be more active in your daily life.  Medicine  Take over-the-counter and prescription medicines only as told by your health care provider. ? Medicine may be prescribed by your health care provider to help lower cholesterol and decrease the risk for heart disease. This is usually done if diet and exercise have failed to bring down cholesterol levels. ? If you have several risk factors, you may need medicine even if your levels are normal.  This information is not intended to replace advice given to you by your health care provider. Make sure you discuss any questions you have with your health care provider. Document Released: 02/28/2001 Document Revised: 01/01/2016 Document Reviewed:  12/04/2015 Elsevier Interactive Patient Education  [employer].

## 2017-08-01 ENCOUNTER — Other Ambulatory Visit: Payer: Self-pay | Admitting: Family Medicine

## 2017-10-18 DIAGNOSIS — H35361 Drusen (degenerative) of macula, right eye: Secondary | ICD-10-CM | POA: Diagnosis not present

## 2017-10-18 DIAGNOSIS — H35363 Drusen (degenerative) of macula, bilateral: Secondary | ICD-10-CM | POA: Diagnosis not present

## 2017-10-18 DIAGNOSIS — H25013 Cortical age-related cataract, bilateral: Secondary | ICD-10-CM | POA: Diagnosis not present

## 2017-10-18 DIAGNOSIS — H2513 Age-related nuclear cataract, bilateral: Secondary | ICD-10-CM | POA: Diagnosis not present

## 2017-10-18 DIAGNOSIS — H524 Presbyopia: Secondary | ICD-10-CM | POA: Diagnosis not present

## 2017-10-31 ENCOUNTER — Other Ambulatory Visit: Payer: Self-pay | Admitting: Family Medicine

## 2017-12-06 ENCOUNTER — Encounter: Payer: Self-pay | Admitting: Medical

## 2017-12-06 ENCOUNTER — Ambulatory Visit: Payer: BLUE CROSS/BLUE SHIELD | Admitting: Medical

## 2017-12-06 VITALS — BP 130/85 | HR 95 | Temp 98.1°F | Resp 16 | Ht 69.0 in | Wt 177.8 lb

## 2017-12-06 DIAGNOSIS — L309 Dermatitis, unspecified: Secondary | ICD-10-CM

## 2017-12-06 DIAGNOSIS — T7840XA Allergy, unspecified, initial encounter: Secondary | ICD-10-CM

## 2017-12-06 MED ORDER — LEVOCETIRIZINE DIHYDROCHLORIDE 5 MG PO TABS: 5.0000 mg | ORAL_TABLET | Freq: Every evening | ORAL | 0 refills | Status: DC

## 2017-12-06 MED ORDER — CLOTRIMAZOLE-BETAMETHASONE 1-0.05 % EX CREA: TOPICAL_CREAM | CUTANEOUS | 0 refills | Status: DC

## 2017-12-06 NOTE — Progress Notes (Signed)
   Subjective:    Patient ID: Amber SleightLoretta M Lerew, female    DOB: 1956/05/17, 62 y.o.   MRN: [id number]  HPI  Pt in with rt upper chest/breast rash. She just noticed this yesterday. It does itch a bit. No burning or sharp pain.   The right breast rash is obvious. Also feels like rash on left breast. But that side rash not obvious.   Pt last mammogram was negative in 2013. Been six years since mammogram.  No sharp sting or bite.   Pt put dotera essential oil to the area but no improvement.   Review of Systems  Constitutional: Negative for chills, fatigue and fever.  Respiratory: Negative for cough, chest tightness, shortness of breath and wheezing.   Cardiovascular: Negative for chest pain and palpitations.  Gastrointestinal: Negative for abdominal distention and abdominal pain.  Musculoskeletal: Negative for back pain.  Skin: Positive for rash.  Neurological: Negative for dizziness, seizures, weakness, light-headedness and headaches.  Hematological: Negative for adenopathy. Does not bruise/bleed easily.  Psychiatric/Behavioral: Negative for behavioral problems, confusion and dysphoric mood.       Objective:   Physical Exam  General- No acute distress. Pleasant patient. Neck- Full range of motion, no jvd Lungs- Clear, even and unlabored. Heart- regular rate and rhythm. Neurologic- CNII- XII grossly intact.   Skin- rt breast.scattered papular rash upper breast toward nipple. No vesicles seen. No induration. No warmth. Faint tender at best.([facility])  Left breast - faint pin point speckle rash upper chest. 10 mm surface area at best.       Assessment & Plan:  You appear to have localized dermatitis versus allergic reaction to upper chest/breast region.  I am going to provide you with Lotrisone cream to apply thin film twice daily.  Also making Xyzal antihistamine available to help with the itching.  Considered other antihistamines but on discussion  wanted to avoid any severe excess sedation.  Differential/other diagnoses considered today discussed.  I do not think this represents shingles or skin infection.  But if the rash worsens as discussed then would ask that you come back in or try to my chart me.  And in that event could prescribe antiviral or antibiotic.  Note you could my chart me on any changes and I will try to check my chart while I am out of the office.  But she might need to also be seen in the office as well.  Follow-up in 7 to 10 days or as needed.  Esperanza RichtersEdward Xandrea Clarey, PA-C

## 2017-12-06 NOTE — Patient Instructions (Addendum)
You appear to have localized dermatitis versus allergic reaction to upper chest/breast region.  I am going to provide you with Lotrisone cream to apply thin film twice daily.  Also making Xyzal antihistamine available to help with the itching.  Considered other antihistamines but on discussion wanted to avoid any severe excess sedation.  Differential/other diagnoses considered today discussed.  I do not think this represents shingles or skin infection.  But if the rash worsens as discussed then would ask that you come back in or try to my chart me.  And in that event could prescribe antiviral or antibiotic.  Note you could my chart me on any changes and I will try to check my chart while I am out of the office.  But you might need to also be seen in the office as well.  Follow-up in 7 to 10 days or as needed.

## 2018-01-30 ENCOUNTER — Other Ambulatory Visit: Payer: Self-pay | Admitting: Family Medicine

## 2018-04-24 ENCOUNTER — Other Ambulatory Visit: Payer: Self-pay | Admitting: Family Medicine

## 2018-05-31 ENCOUNTER — Other Ambulatory Visit: Payer: Self-pay | Admitting: Family Medicine

## 2018-07-04 ENCOUNTER — Ambulatory Visit: Payer: BLUE CROSS/BLUE SHIELD | Admitting: Family Medicine

## 2018-07-04 ENCOUNTER — Encounter: Payer: Self-pay | Admitting: Family Medicine

## 2018-07-04 ENCOUNTER — Encounter

## 2018-07-04 VITALS — BP 110/68 | HR 89 | Temp 98.1°F | Resp 18 | Ht 69.0 in | Wt 182.6 lb

## 2018-07-04 DIAGNOSIS — Z1239 Encounter for other screening for malignant neoplasm of breast: Secondary | ICD-10-CM

## 2018-07-04 DIAGNOSIS — E782 Mixed hyperlipidemia: Secondary | ICD-10-CM

## 2018-07-04 DIAGNOSIS — M1990 Unspecified osteoarthritis, unspecified site: Secondary | ICD-10-CM | POA: Diagnosis not present

## 2018-07-04 DIAGNOSIS — I10 Essential (primary) hypertension: Secondary | ICD-10-CM | POA: Diagnosis not present

## 2018-07-04 MED ORDER — AMLODIPINE BESYLATE 10 MG PO TABS: ORAL_TABLET | ORAL | 1 refills | Status: DC

## 2018-07-04 MED ORDER — CARVEDILOL 6.25 MG PO TABS: ORAL_TABLET | ORAL | 1 refills | Status: DC

## 2018-07-04 NOTE — Patient Instructions (Signed)
Omron blood pressure cuff, upper arm cuff Hypertension Hypertension, commonly called high blood pressure, is when the force of blood pumping through the arteries is too strong. The arteries are the blood vessels that carry blood from the heart throughout the body. Hypertension forces the heart to work harder to pump blood and may cause arteries to become narrow or stiff. Having untreated or uncontrolled hypertension can cause heart attacks, strokes, kidney disease, and other problems. A blood pressure reading consists of a higher number over a lower number. Ideally, your blood pressure should be below 120/80. The first ("top") number is called the systolic pressure. It is a measure of the pressure in your arteries as your heart beats. The second ("bottom") number is called the diastolic pressure. It is a measure of the pressure in your arteries as the heart relaxes. What are the causes? The cause of this condition is not known. What increases the risk? Some risk factors for high blood pressure are under your control. Others are not. Factors you can change  Smoking.  Having type 2 diabetes mellitus, high cholesterol, or both.  Not getting enough exercise or physical activity.  Being overweight.  Having too much fat, sugar, calories, or salt (sodium) in your diet.  Drinking too much alcohol. Factors that are difficult or impossible to change  Having chronic kidney disease.  Having a family history of high blood pressure.  Age. Risk increases with age.  Race. You may be at higher risk if you are African-American.  Gender. Men are at higher risk than women before age 62. After age 62, women are at higher risk than men.  Having obstructive sleep apnea.  Stress. What are the signs or symptoms? Extremely high blood pressure (hypertensive crisis) may cause:  Headache.  Anxiety.  Shortness of breath.  Nosebleed.  Nausea and vomiting.  Severe chest pain.  Jerky movements you  cannot control (seizures). How is this diagnosed? This condition is diagnosed by measuring your blood pressure while you are seated, with your arm resting on a surface. The cuff of the blood pressure monitor will be placed directly against the skin of your upper arm at the level of your heart. It should be measured at least twice using the same arm. Certain conditions can cause a difference in blood pressure between your right and left arms. Certain factors can cause blood pressure readings to be lower or higher than normal (elevated) for a short period of time:  When your blood pressure is higher when you are in a health care provider's office than when you are at home, this is called white coat hypertension. Most people with this condition do not need medicines.  When your blood pressure is higher at home than when you are in a health care provider's office, this is called masked hypertension. Most people with this condition may need medicines to control blood pressure. If you have a high blood pressure reading during one visit or you have normal blood pressure with other risk factors:  You may be asked to return on a different day to have your blood pressure checked again.  You may be asked to monitor your blood pressure at home for 1 week or longer. If you are diagnosed with hypertension, you may have other blood or imaging tests to help your health care provider understand your overall risk for other conditions. How is this treated? This condition is treated by making healthy lifestyle changes, such as eating healthy foods, exercising more, and reducing  your alcohol intake. Your health care provider may prescribe medicine if lifestyle changes are not enough to get your blood pressure under control, and if:  Your systolic blood pressure is above 130.  Your diastolic blood pressure is above 80. Your personal target blood pressure may vary depending on your medical conditions, your age, and  other factors. Follow these instructions at home: Eating and drinking   Eat a diet that is high in fiber and potassium, and low in sodium, added sugar, and fat. An example eating plan is called the DASH (Dietary Approaches to Stop Hypertension) diet. To eat this way: ? Eat plenty of fresh fruits and vegetables. Try to fill half of your plate at each meal with fruits and vegetables. ? Eat whole grains, such as whole wheat pasta, brown rice, or whole grain bread. Fill about one quarter of your plate with whole grains. ? Eat or drink low-fat dairy products, such as skim milk or low-fat yogurt. ? Avoid fatty cuts of meat, processed or cured meats, and poultry with skin. Fill about one quarter of your plate with lean proteins, such as fish, chicken without skin, beans, eggs, and tofu. ? Avoid premade and processed foods. These tend to be higher in sodium, added sugar, and fat.  Reduce your daily sodium intake. Most people with hypertension should eat less than 1,500 mg of sodium a day.  Limit alcohol intake to no more than 1 drink a day for nonpregnant women and 2 drinks a day for men. One drink equals 12 oz of beer, 5 oz of wine, or 1 oz of hard liquor. Lifestyle   Work with your health care provider to maintain a healthy body weight or to lose weight. Ask what an ideal weight is for you.  Get at least 30 minutes of exercise that causes your heart to beat faster (aerobic exercise) most days of the week. Activities may include walking, swimming, or biking.  Include exercise to strengthen your muscles (resistance exercise), such as pilates or lifting weights, as part of your weekly exercise routine. Try to do these types of exercises for 30 minutes at least 3 days a week.  Do not use any products that contain nicotine or tobacco, such as cigarettes and e-cigarettes. If you need help quitting, ask your health care provider.  Monitor your blood pressure at home as told by your health care  provider.  Keep all follow-up visits as told by your health care provider. This is important. Medicines  Take over-the-counter and prescription medicines only as told by your health care provider. Follow directions carefully. Blood pressure medicines must be taken as prescribed.  Do not skip doses of blood pressure medicine. Doing this puts you at risk for problems and can make the medicine less effective.  Ask your health care provider about side effects or reactions to medicines that you should watch for. Contact a health care provider if:  You think you are having a reaction to a medicine you are taking.  You have headaches that keep coming back (recurring).  You feel dizzy.  You have swelling in your ankles.  You have trouble with your vision. Get help right away if:  You develop a severe headache or confusion.  You have unusual weakness or numbness.  You feel faint.  You have severe pain in your chest or abdomen.  You vomit repeatedly.  You have trouble breathing. Summary  Hypertension is when the force of blood pumping through your arteries is too strong.  If this condition is not controlled, it may put you at risk for serious complications.  Your personal target blood pressure may vary depending on your medical conditions, your age, and other factors. For most people, a normal blood pressure is less than 120/80.  Hypertension is treated with lifestyle changes, medicines, or a combination of both. Lifestyle changes include weight loss, eating a healthy, low-sodium diet, exercising more, and limiting alcohol. This information is not intended to replace advice given to you by your health care provider. Make sure you discuss any questions you have with your health care provider. Document Released: 06/05/2005 Document Revised: 05/03/2016 Document Reviewed: 05/03/2016 Elsevier Interactive Patient Education  2019 [employer].

## 2018-07-04 NOTE — Assessment & Plan Note (Signed)
Well controlled, no changes to meds. Encouraged heart healthy diet such as the DASH diet and exercise as tolerated.  °

## 2018-07-05 LAB — CBC
HCT: 40.9 % (ref 36.0–46.0)
Hemoglobin: 13.8 g/dL (ref 12.0–15.0)
MCHC: 33.7 g/dL (ref 30.0–36.0)
MCV: 84.8 fl (ref 78.0–100.0)
PLATELETS: 254 10*3/uL (ref 150.0–400.0)
RBC: 4.82 Mil/uL (ref 3.87–5.11)
RDW: 14.9 % (ref 11.5–15.5)
WBC: 4 10*3/uL (ref 4.0–10.5)

## 2018-07-05 LAB — COMPREHENSIVE METABOLIC PANEL
ALK PHOS: 62 U/L (ref 39–117)
ALT: 12 U/L (ref 0–35)
AST: 17 U/L (ref 0–37)
Albumin: 4.4 g/dL (ref 3.5–5.2)
BILIRUBIN TOTAL: 0.8 mg/dL (ref 0.2–1.2)
BUN: 20 mg/dL (ref 6–23)
CO2: 25 meq/L (ref 19–32)
CREATININE: 1.01 mg/dL (ref 0.40–1.20)
Calcium: 9.6 mg/dL (ref 8.4–10.5)
Chloride: 109 mEq/L (ref 96–112)
GFR: 67.36 mL/min (ref >60.00)
GLUCOSE: 78 mg/dL (ref 70–99)
Potassium: 4.8 mEq/L (ref 3.5–5.1)
Sodium: 142 mEq/L (ref 135–145)
TOTAL PROTEIN: 7.4 g/dL (ref 6.0–8.3)

## 2018-07-05 LAB — LIPID PANEL
CHOL/HDL RATIO: 3
Cholesterol: 207 mg/dL — ABNORMAL HIGH (ref 0–200)
HDL: 78.9 mg/dL (ref >39.00)
LDL Cholesterol: 117 mg/dL — ABNORMAL HIGH (ref 0–99)
NONHDL: 128.21
Triglycerides: 54 mg/dL (ref 0.0–149.0)
VLDL: 10.8 mg/dL (ref 0.0–40.0)

## 2018-07-05 LAB — TSH: TSH: 2.44 u[IU]/mL (ref 0.35–4.50)

## 2018-07-08 DIAGNOSIS — M1990 Unspecified osteoarthritis, unspecified site: Secondary | ICD-10-CM | POA: Insufficient documentation

## 2018-07-08 NOTE — Assessment & Plan Note (Signed)
Stiffness right hand keep the hand as mobile as possible by exercising

## 2018-07-08 NOTE — Progress Notes (Signed)
Subjective:    Patient ID: Amber [name], female    DOB: 1955/11/25, 63 y.o.   MRN: [id number]  No chief complaint on file.   HPI Patient is in today for follow up. No recent febrile illness or hospitalizations. She feels well and her only complaint is stiffness in her fingers in the right hand. Denies CP/palp/SOB/HA/congestion/fevers/GI or GU c/o. Taking meds as prescribed  Past Medical History:  Diagnosis Date  . Allergic state 09/19/2013  . Allergy   . Angioedema   . Chicken pox   . Epistaxis 01/15/2013  . Hyperlipidemia 10/08/2016  . Hypertension   . Leukopenia 12/06/2014  . Measles   . Mumps   . Overweight 08/15/2007   Qualifier: Diagnosis of  By: Nena Jordan     History reviewed. No pertinent surgical history.  Family History  Problem Relation Age of Onset  . Diabetes Mother 79  . Kidney disease Mother   . Heart disease Mother   . Cancer Father        pancreatic  . Pancreatic cancer Father   . Hypertension Brother   . Diabetes Brother   . Hypertension Sister   . Diabetes Paternal Grandmother   . Stroke Paternal Grandfather   . Hypertension Sister   . Hypertension Sister   . Hypertension Sister   . Hypertension Other        multiple  . Colon cancer Neg Hx   . Esophageal cancer Neg Hx   . Rectal cancer Neg Hx   . Stomach cancer Neg Hx     Social History   Socioeconomic History  . Marital status: Married    Spouse name: Not on file  . Number of children: Not on file  . Years of education: Not on file  . Highest education level: Not on file  Occupational History  . Not on file  Social Needs  . Financial resource strain: Not on file  . Food insecurity:    Worry: Not on file    Inability: Not on file  . Transportation needs:    Medical: Not on file    Non-medical: Not on file  Tobacco Use  . Smoking status: Never Smoker  . Smokeless tobacco: Never Used  Substance and Sexual Activity  . Alcohol use: No  . Drug use: No  . Sexual  activity: Never    Comment: no dietary restrictions, lives alone, insurance work  Lifestyle  . Physical activity:    Days per week: Not on file    Minutes per session: Not on file  . Stress: Not on file  Relationships  . Social connections:    Talks on phone: Not on file    Gets together: Not on file    Attends religious service: Not on file    Active member of club or organization: Not on file    Attends meetings of clubs or organizations: Not on file    Relationship status: Not on file  . Intimate partner violence:    Fear of current or ex partner: Not on file    Emotionally abused: Not on file    Physically abused: Not on file    Forced sexual activity: Not on file  Other Topics Concern  . Not on file  Social History Narrative  . Not on file    Outpatient Medications Prior to Visit  Medication Sig Dispense Refill  . levocetirizine (XYZAL) 5 MG tablet Take 1 tablet (5 mg total) by mouth every  evening. 30 tablet 0  . Multiple Vitamins-Minerals (MEGA MULTIVITAMIN FOR WOMEN PO) Take 1 tablet by mouth daily. Reported on 10/20/2015    . Omega-3 Fatty Acids (FISH OIL) 1000 MG CAPS Take 1 capsule by mouth daily.    Marland Kitchen. amLODipine (NORVASC) 10 MG tablet TAKE 1 TABLET BY MOUTH DAILY 30 tablet 0  . carvedilol (COREG) 6.25 MG tablet TAKE 1 TABLET(6.25 MG) BY MOUTH TWICE DAILY WITH A MEAL 180 tablet 0  . clotrimazole-betamethasone (LOTRISONE) cream Apply thin film twice daily for 10 days 30 g 0   No facility-administered medications prior to visit.     No Known Allergies  Review of Systems  Constitutional: Negative for fever and malaise/fatigue.  HENT: Negative for congestion.   Eyes: Negative for blurred vision.  Respiratory: Negative for shortness of breath.   Cardiovascular: Negative for chest pain, palpitations and leg swelling.  Gastrointestinal: Negative for abdominal pain, blood in stool and nausea.  Genitourinary: Negative for dysuria and frequency.  Musculoskeletal:  Positive for joint pain. Negative for back pain and falls.  Skin: Negative for rash.  Neurological: Negative for dizziness, loss of consciousness and headaches.  Endo/Heme/Allergies: Negative for environmental allergies.  Psychiatric/Behavioral: Negative for depression. The patient is not nervous/anxious.        Objective:    Physical Exam Vitals signs and nursing note reviewed.  Constitutional:      General: She is not in acute distress.    Appearance: She is well-developed.  HENT:     Head: Normocephalic and atraumatic.     Nose: Nose normal.  Eyes:     General:        Right eye: No discharge.        Left eye: No discharge.  Neck:     Musculoskeletal: Normal range of motion and neck supple.  Cardiovascular:     Rate and Rhythm: Normal rate and regular rhythm.     Heart sounds: No murmur.  Pulmonary:     Effort: Pulmonary effort is normal.     Breath sounds: Normal breath sounds.  Abdominal:     General: Bowel sounds are normal.     Palpations: Abdomen is soft.     Tenderness: There is no abdominal tenderness.  Skin:    General: Skin is warm and dry.  Neurological:     Mental Status: She is alert and oriented to person, place, and time.     BP 110/68 (BP Location: Left Arm, Patient Position: Sitting, Cuff Size: Normal)   Pulse 89   Temp 98.1 F (36.7 C) (Oral)   Resp 18   Ht 5\' 9"  (1.753 m)   Wt 182 lb 9.6 oz (82.8 kg)   SpO2 98%   BMI 26.97 kg/m  Wt Readings from Last 3 Encounters:  07/04/18 182 lb 9.6 oz (82.8 kg)  12/06/17 177 lb 12.8 oz (80.6 kg)  07/05/17 178 lb 6.4 oz (80.9 kg)     Lab Results  Component Value Date   WBC 4.0 07/04/2018   HGB 13.8 07/04/2018   HCT 40.9 07/04/2018   PLT 254.0 07/04/2018   GLUCOSE 78 07/04/2018   CHOL 207 (H) 07/04/2018   TRIG 54.0 07/04/2018   HDL 78.90 07/04/2018   LDLCALC 117 (H) 07/04/2018   ALT 12 07/04/2018   AST 17 07/04/2018   NA 142 07/04/2018   K 4.8 07/04/2018   CL 109 07/04/2018   CREATININE  1.01 07/04/2018   BUN 20 07/04/2018   CO2 25 07/04/2018  TSH 2.44 07/04/2018    Lab Results  Component Value Date   TSH 2.44 07/04/2018   Lab Results  Component Value Date   WBC 4.0 07/04/2018   HGB 13.8 07/04/2018   HCT 40.9 07/04/2018   MCV 84.8 07/04/2018   PLT 254.0 07/04/2018   Lab Results  Component Value Date   NA 142 07/04/2018   K 4.8 07/04/2018   CO2 25 07/04/2018   GLUCOSE 78 07/04/2018   BUN 20 07/04/2018   CREATININE 1.01 07/04/2018   BILITOT 0.8 07/04/2018   ALKPHOS 62 07/04/2018   AST 17 07/04/2018   ALT 12 07/04/2018   PROT 7.4 07/04/2018   ALBUMIN 4.4 07/04/2018   CALCIUM 9.6 07/04/2018   GFR 67.36 07/04/2018   Lab Results  Component Value Date   CHOL 207 (H) 07/04/2018   Lab Results  Component Value Date   HDL 78.90 07/04/2018   Lab Results  Component Value Date   LDLCALC 117 (H) 07/04/2018   Lab Results  Component Value Date   TRIG 54.0 07/04/2018   Lab Results  Component Value Date   CHOLHDL 3 07/04/2018   No results found for: HGBA1C     Assessment & Plan:   Problem List Items Addressed This Visit    Essential hypertension    Well controlled, no changes to meds. Encouraged heart healthy diet such as the DASH diet and exercise as tolerated.       Relevant Medications   amLODipine (NORVASC) 10 MG tablet   carvedilol (COREG) 6.25 MG tablet   Other Relevant Orders   CBC (Completed)   Comprehensive metabolic panel (Completed)   TSH (Completed)   Breast cancer screening    Breast cancer screen with mgm ordered      Relevant Orders   MM 3D SCREEN BREAST BILATERAL   Hyperlipidemia - Primary   Relevant Medications   amLODipine (NORVASC) 10 MG tablet   carvedilol (COREG) 6.25 MG tablet   Other Relevant Orders   Lipid panel (Completed)   Arthritis    Stiffness right hand keep the hand as mobile as possible by exercising         I have discontinued Fransico MeadowLoretta M. Gardin's clotrimazole-betamethasone. I am also having her  maintain her Multiple Vitamins-Minerals (MEGA MULTIVITAMIN FOR WOMEN PO), Fish Oil, levocetirizine, amLODipine, and carvedilol.  Meds ordered this encounter  Medications  . amLODipine (NORVASC) 10 MG tablet    Sig: TAKE 1 TABLET BY MOUTH DAILY    Dispense:  90 tablet    Refill:  1  . carvedilol (COREG) 6.25 MG tablet    Sig: TAKE 1 TABLET(6.25 MG) BY MOUTH TWICE DAILY WITH A MEAL    Dispense:  180 tablet    Refill:  1     Danise EdgeStacey Yahya Boldman, MD

## 2018-07-08 NOTE — Assessment & Plan Note (Addendum)
Breast cancer screen with mgm ordered

## 2018-12-10 ENCOUNTER — Other Ambulatory Visit: Payer: Self-pay | Admitting: Family Medicine

## 2018-12-25 ENCOUNTER — Ambulatory Visit (HOSPITAL_BASED_OUTPATIENT_CLINIC_OR_DEPARTMENT_OTHER): Payer: BLUE CROSS/BLUE SHIELD

## 2019-01-09 ENCOUNTER — Encounter: Payer: BLUE CROSS/BLUE SHIELD | Admitting: Family Medicine

## 2019-04-01 ENCOUNTER — Other Ambulatory Visit: Payer: Self-pay | Admitting: Family Medicine

## 2019-04-01 DIAGNOSIS — Z1231 Encounter for screening mammogram for malignant neoplasm of breast: Secondary | ICD-10-CM

## 2019-04-02 ENCOUNTER — Ambulatory Visit (HOSPITAL_BASED_OUTPATIENT_CLINIC_OR_DEPARTMENT_OTHER)
Admission: RE | Admit: 2019-04-02 | Discharge: 2019-04-02 | Disposition: A | Discharge status: Home / Self Care | Payer: BC Managed Care – PPO | Source: Ambulatory Visit | Attending: Family Medicine | Admitting: Family Medicine

## 2019-04-02 ENCOUNTER — Other Ambulatory Visit: Payer: Self-pay

## 2019-04-02 DIAGNOSIS — Z1231 Encounter for screening mammogram for malignant neoplasm of breast: Secondary | ICD-10-CM | POA: Insufficient documentation

## 2019-04-02 DIAGNOSIS — Z1239 Encounter for other screening for malignant neoplasm of breast: Secondary | ICD-10-CM

## 2019-05-21 ENCOUNTER — Ambulatory Visit: Payer: BC Managed Care – PPO

## 2019-06-06 ENCOUNTER — Other Ambulatory Visit: Payer: Self-pay | Admitting: Family Medicine

## 2019-06-06 NOTE — Telephone Encounter (Signed)
Last OV 07/04/18 Last refill 12/12/18 #90/1 Next OV not scheduled

## 2019-07-22 ENCOUNTER — Other Ambulatory Visit: Payer: Self-pay | Admitting: Family Medicine

## 2019-09-17 ENCOUNTER — Other Ambulatory Visit: Payer: Self-pay | Admitting: Family Medicine

## 2019-12-15 ENCOUNTER — Other Ambulatory Visit: Payer: Self-pay | Admitting: Family Medicine

## 2020-01-06 ENCOUNTER — Ambulatory Visit (INDEPENDENT_AMBULATORY_CARE_PROVIDER_SITE_OTHER): Payer: 59 | Admitting: Medical

## 2020-01-06 ENCOUNTER — Encounter: Payer: Self-pay | Admitting: Medical

## 2020-01-06 ENCOUNTER — Other Ambulatory Visit: Payer: Self-pay

## 2020-01-06 VITALS — BP 137/79 | HR 80 | Resp 18 | Ht 69.0 in | Wt 190.0 lb

## 2020-01-06 DIAGNOSIS — Z0000 Encounter for general adult medical examination without abnormal findings: Secondary | ICD-10-CM

## 2020-01-06 DIAGNOSIS — Z124 Encounter for screening for malignant neoplasm of cervix: Secondary | ICD-10-CM

## 2020-01-06 LAB — LIPID PANEL
Cholesterol: 171 mg/dL (ref 0–200)
HDL: 79.4 mg/dL (ref >39.00)
LDL Cholesterol: 84 mg/dL (ref 0–99)
NonHDL: 91.16
Total CHOL/HDL Ratio: 2
Triglycerides: 36 mg/dL (ref 0.0–149.0)
VLDL: 7.2 mg/dL (ref 0.0–40.0)

## 2020-01-06 LAB — CBC WITH DIFFERENTIAL/PLATELET
Basophils Absolute: 0 10*3/uL (ref 0.0–0.1)
Basophils Relative: 1.2 % (ref 0.0–3.0)
Eosinophils Absolute: 0.2 10*3/uL (ref 0.0–0.7)
Eosinophils Relative: 5.6 % — ABNORMAL HIGH (ref 0.0–5.0)
HCT: 40.9 % (ref 36.0–46.0)
Hemoglobin: 13.7 g/dL (ref 12.0–15.0)
Lymphocytes Relative: 41.7 % (ref 12.0–46.0)
Lymphs Abs: 1.4 10*3/uL (ref 0.7–4.0)
MCHC: 33.4 g/dL (ref 30.0–36.0)
MCV: 85 fl (ref 78.0–100.0)
Monocytes Absolute: 0.4 10*3/uL (ref 0.1–1.0)
Monocytes Relative: 10.9 % (ref 3.0–12.0)
Neutro Abs: 1.4 10*3/uL (ref 1.4–7.7)
Neutrophils Relative %: 40.6 % — ABNORMAL LOW (ref 43.0–77.0)
Platelets: 238 10*3/uL (ref 150.0–400.0)
RBC: 4.81 Mil/uL (ref 3.87–5.11)
RDW: 14.8 % (ref 11.5–15.5)
WBC: 3.3 10*3/uL — ABNORMAL LOW (ref 4.0–10.5)

## 2020-01-06 LAB — COMPREHENSIVE METABOLIC PANEL
ALT: 16 U/L (ref 0–35)
AST: 19 U/L (ref 0–37)
Albumin: 4.3 g/dL (ref 3.5–5.2)
Alkaline Phosphatase: 73 U/L (ref 39–117)
BUN: 11 mg/dL (ref 6–23)
CO2: 28 mEq/L (ref 19–32)
Calcium: 9.5 mg/dL (ref 8.4–10.5)
Chloride: 108 mEq/L (ref 96–112)
Creatinine, Ser: 0.86 mg/dL (ref 0.40–1.20)
GFR: 80.7 mL/min (ref >60.00)
Glucose, Bld: 92 mg/dL (ref 70–99)
Potassium: 4.4 mEq/L (ref 3.5–5.1)
Sodium: 141 mEq/L (ref 135–145)
Total Bilirubin: 1 mg/dL (ref 0.2–1.2)
Total Protein: 7.3 g/dL (ref 6.0–8.3)

## 2020-01-06 NOTE — Progress Notes (Signed)
Subjective:    Patient ID: Amber [name], female    DOB: June 26, 1955, 64 y.o.   MRN: [id number]  HPI  Pt in for wellness exam/physical.  Pt has not gotten covid vaccine. She is still deciding. Counseled pt on benefit vs risk.   Pt is fasting. Pt walks 3-4 days a weeks.no caffeine beverages. Nonsmoker. No alcohol use.  Pt late on pap. Last pap was normal.   Mammogram last year/up to date.  Pt states within past years she had coloonscopy and that was normal.    Review of Systems  Constitutional: Negative for chills, fatigue and fever.  HENT: Negative for congestion, ear pain, postnasal drip, rhinorrhea, sinus pressure and sinus pain.   Respiratory: Negative for cough, chest tightness, shortness of breath and wheezing.   Cardiovascular: Negative for chest pain and palpitations.  Gastrointestinal: Negative for abdominal pain and constipation.  Genitourinary: Negative for dysuria.  Musculoskeletal: Negative for back pain and myalgias.  Skin: Negative for rash.  Neurological: Negative for dizziness, weakness, numbness and headaches.  Hematological: Negative for adenopathy. Does not bruise/bleed easily.  Psychiatric/Behavioral: Negative for behavioral problems, confusion and sleep disturbance. The patient is not nervous/anxious.     Past Medical History:  Diagnosis Date  . Allergic state 09/19/2013  . Allergy   . Angioedema   . Chicken pox   . Epistaxis 01/15/2013  . Hyperlipidemia 10/08/2016  . Hypertension   . Leukopenia 12/06/2014  . Measles   . Mumps   . Overweight 08/15/2007   Qualifier: Diagnosis of  By: Nena Jordan      Social History   Socioeconomic History  . Marital status: Divorced    Spouse name: Not on file  . Number of children: Not on file  . Years of education: Not on file  . Highest education level: Not on file  Occupational History  . Not on file  Tobacco Use  . Smoking status: Never Smoker  . Smokeless tobacco: Never Used  Substance and  Sexual Activity  . Alcohol use: No  . Drug use: No  . Sexual activity: Never    Comment: no dietary restrictions, lives alone, insurance work  Other Topics Concern  . Not on file  Social History Narrative  . Not on file   Social Determinants of Health   Financial Resource Strain:   . Difficulty of Paying Living Expenses:   Food Insecurity:   . Worried About Programme researcher, broadcasting/film/video in the Last Year:   . Barista in the Last Year:   Transportation Needs:   . Freight forwarder (Medical):   Marland Kitchen Lack of Transportation (Non-Medical):   Physical Activity:   . Days of Exercise per Week:   . Minutes of Exercise per Session:   Stress:   . Feeling of Stress :   Social Connections:   . Frequency of Communication with Friends and Family:   . Frequency of Social Gatherings with Friends and Family:   . Attends Religious Services:   . Active Member of Clubs or Organizations:   . Attends Banker Meetings:   Marland Kitchen Marital Status:   Intimate Partner Violence:   . Fear of Current or Ex-Partner:   . Emotionally Abused:   Marland Kitchen Physically Abused:   . Sexually Abused:     No past surgical history on file.  Family History  Problem Relation Age of Onset  . Diabetes Mother 59  . Kidney disease Mother   . Heart  disease Mother   . Cancer Father        pancreatic  . Pancreatic cancer Father   . Hypertension Brother   . Diabetes Brother   . Hypertension Sister   . Diabetes Paternal Grandmother   . Stroke Paternal Grandfather   . Hypertension Sister   . Hypertension Sister   . Hypertension Sister   . Hypertension Other        multiple  . Colon cancer Neg Hx   . Esophageal cancer Neg Hx   . Rectal cancer Neg Hx   . Stomach cancer Neg Hx     No Known Allergies  Current Outpatient Medications on File Prior to Visit  Medication Sig Dispense Refill  . amLODipine (NORVASC) 10 MG tablet TAKE 1 TABLET BY MOUTH DAILY 90 tablet 0  . carvedilol (COREG) 6.25 MG tablet TAKE 1  TABLET(6.25 MG) BY MOUTH TWICE DAILY WITH A MEAL 180 tablet 1  . levocetirizine (XYZAL) 5 MG tablet Take 1 tablet (5 mg total) by mouth every evening. (Patient not taking: Reported on 01/06/2020) 30 tablet 0  . Multiple Vitamins-Minerals (MEGA MULTIVITAMIN FOR WOMEN PO) Take 1 tablet by mouth daily. Reported on 10/20/2015    . Omega-3 Fatty Acids (FISH OIL) 1000 MG CAPS Take 1 capsule by mouth daily. (Patient not taking: Reported on 01/06/2020)     No current facility-administered medications on file prior to visit.    BP 130/76 (BP Location: Left Arm, Patient Position: Sitting, Cuff Size: Large)   Pulse 80   Resp 18   Ht 5\' 9"  (1.753 m)   Wt 190 lb (86.2 kg)   SpO2 98%   BMI 28.06 kg/m       Objective:   Physical Exam  General Mental Status- Alert. General Appearance- Not in acute distress.   Skin General: Color- Normal Color. Moisture- Normal Moisture.  Neck Carotid Arteries- Normal color. Moisture- Normal Moisture. No carotid bruits. No JVD.  Chest and Lung Exam Auscultation: Breath Sounds:-Normal.  Cardiovascular Auscultation:Rythm- Regular. Murmurs & Other Heart Sounds:Auscultation of the heart reveals- No Murmurs.  Abdomen Inspection:-Inspeection Normal. Palpation/Percussion:Note:No mass. Palpation and Percussion of the abdomen reveal- Non Tender, Non Distended + BS, no rebound or guarding.    Neurologic Cranial Nerve exam:- CN III-XII intact(No nystagmus), symmetric smile. Drift Test:- No drift. Finger to Nose:- Normal/Intact Strength:- 5/5 equal and symmetric strength both upper and lower extremities.      Assessment & Plan:  For you wellness exam today I have ordered cbc, cmp and  lipid panel.  Benefit vs risk covid vaccine discussed today.   Recommend exercise and healthy diet.  We will let you know lab results as they come in.  Follow up date appointment will be determined after lab review.   , PA-C

## 2020-01-06 NOTE — Patient Instructions (Addendum)
For you wellness exam today I have ordered cbc, cmp and  lipid panel.  Benefit vs risk covid vaccine discussed today.   Recommend exercise and healthy diet.  We will let you know lab results as they come in.  Follow up date appointment will be determined after lab review.    Preventive Care 40-63 Years Old, Female Preventive care refers to visits with your health care provider and lifestyle choices that can promote health and wellness. This includes:  A yearly physical exam. This may also be called an annual well check.  Regular dental visits and eye exams.  Immunizations.  Screening for certain conditions.  Healthy lifestyle choices, such as eating a healthy diet, getting regular exercise, not using drugs or products that contain nicotine and tobacco, and limiting alcohol use. What can I expect for my preventive care visit? Physical exam Your health care provider will check your:  Height and weight. This may be used to calculate body mass index (BMI), which tells if you are at a healthy weight.  Heart rate and blood pressure.  Skin for abnormal spots. Counseling Your health care provider may ask you questions about your:  Alcohol, tobacco, and drug use.  Emotional well-being.  Home and relationship well-being.  Sexual activity.  Eating habits.  Work and work Statistician.  Method of birth control.  Menstrual cycle.  Pregnancy history. What immunizations do I need?  Influenza (flu) vaccine  This is recommended every year. Tetanus, diphtheria, and pertussis (Tdap) vaccine  You may need a Td booster every 10 years. Varicella (chickenpox) vaccine  You may need this if you have not been vaccinated. Zoster (shingles) vaccine  You may need this after age 74. Measles, mumps, and rubella (MMR) vaccine  You may need at least one dose of MMR if you were born in 1957 or later. You may also need a second dose. Pneumococcal conjugate (PCV13) vaccine  You may  need this if you have certain conditions and were not previously vaccinated. Pneumococcal polysaccharide (PPSV23) vaccine  You may need one or two doses if you smoke cigarettes or if you have certain conditions. Meningococcal conjugate (MenACWY) vaccine  You may need this if you have certain conditions. Hepatitis A vaccine  You may need this if you have certain conditions or if you travel or work in places where you may be exposed to hepatitis A. Hepatitis B vaccine  You may need this if you have certain conditions or if you travel or work in places where you may be exposed to hepatitis B. Haemophilus influenzae type b (Hib) vaccine  You may need this if you have certain conditions. Human papillomavirus (HPV) vaccine  If recommended by your health care provider, you may need three doses over 6 months. You may receive vaccines as individual doses or as more than one vaccine together in one shot (combination vaccines). Talk with your health care provider about the risks and benefits of combination vaccines. What tests do I need? Blood tests  Lipid and cholesterol levels. These may be checked every 5 years, or more frequently if you are over 36 years old.  Hepatitis C test.  Hepatitis B test. Screening  Lung cancer screening. You may have this screening every year starting at age 5 if you have a 30-pack-year history of smoking and currently smoke or have quit within the past 15 years.  Colorectal cancer screening. All adults should have this screening starting at age 20 and continuing until age 26. Your health care  provider may recommend screening at age 13 if you are at increased risk. You will have tests every 1-10 years, depending on your results and the type of screening test.  Diabetes screening. This is done by checking your blood sugar (glucose) after you have not eaten for a while (fasting). You may have this done every 1-3 years.  Mammogram. This may be done every 1-2  years. Talk with your health care provider about when you should start having regular mammograms. This may depend on whether you have a family history of breast cancer.  BRCA-related cancer screening. This may be done if you have a family history of breast, ovarian, tubal, or peritoneal cancers.  Pelvic exam and Pap test. This may be done every 3 years starting at age 80. Starting at age 27, this may be done every 5 years if you have a Pap test in combination with an HPV test. Other tests  Sexually transmitted disease (STD) testing.  Bone density scan. This is done to screen for osteoporosis. You may have this scan if you are at high risk for osteoporosis. Follow these instructions at home: Eating and drinking  Eat a diet that includes fresh fruits and vegetables, whole grains, lean protein, and low-fat dairy.  Take vitamin and mineral supplements as recommended by your health care provider.  Do not drink alcohol if: ? Your health care provider tells you not to drink. ? You are pregnant, may be pregnant, or are planning to become pregnant.  If you drink alcohol: ? Limit how much you have to 0-1 drink a day. ? Be aware of how much alcohol is in your drink. In the [place] one drink equals one 12 oz bottle of beer (355 mL), one 5 oz glass of wine (148 mL), or one 1 oz glass of hard liquor (44 mL). Lifestyle  Take daily care of your teeth and gums.  Stay active. Exercise for at least 30 minutes on 5 or more days each week.  Do not use any products that contain nicotine or tobacco, such as cigarettes, e-cigarettes, and chewing tobacco. If you need help quitting, ask your health care provider.  If you are sexually active, practice safe sex. Use a condom or other form of birth control (contraception) in order to prevent pregnancy and STIs (sexually transmitted infections).  If told by your health care provider, take low-dose aspirin daily starting at age 23. What's next?  Visit your  health care provider once a year for a well check visit.  Ask your health care provider how often you should have your eyes and teeth checked.  Stay up to date on all vaccines. This information is not intended to replace advice given to you by your health care provider. Make sure you discuss any questions you have with your health care provider. Document Revised: 02/14/2018 Document Reviewed: 02/14/2018 Elsevier Patient Education  2020 [employer].

## 2020-01-16 ENCOUNTER — Encounter: Payer: BC Managed Care – PPO | Admitting: Medical

## 2020-03-07 ENCOUNTER — Other Ambulatory Visit: Payer: Self-pay | Admitting: Family Medicine

## 2020-03-17 ENCOUNTER — Encounter: Payer: 59 | Admitting: Obstetrics & Gynecology

## 2020-03-31 ENCOUNTER — Other Ambulatory Visit (HOSPITAL_BASED_OUTPATIENT_CLINIC_OR_DEPARTMENT_OTHER): Payer: Self-pay | Admitting: Family Medicine

## 2020-03-31 DIAGNOSIS — Z1231 Encounter for screening mammogram for malignant neoplasm of breast: Secondary | ICD-10-CM

## 2020-04-21 ENCOUNTER — Encounter: Payer: Self-pay | Admitting: Obstetrics & Gynecology

## 2020-05-10 ENCOUNTER — Other Ambulatory Visit: Payer: Self-pay

## 2020-05-10 ENCOUNTER — Ambulatory Visit (HOSPITAL_BASED_OUTPATIENT_CLINIC_OR_DEPARTMENT_OTHER)
Admission: RE | Admit: 2020-05-10 | Discharge: 2020-05-10 | Disposition: A | Discharge status: Home / Self Care | Payer: BC Managed Care – PPO | Source: Ambulatory Visit | Attending: Family Medicine | Admitting: Family Medicine

## 2020-05-10 DIAGNOSIS — Z1231 Encounter for screening mammogram for malignant neoplasm of breast: Secondary | ICD-10-CM | POA: Diagnosis not present

## 2020-05-11 ENCOUNTER — Ambulatory Visit (HOSPITAL_BASED_OUTPATIENT_CLINIC_OR_DEPARTMENT_OTHER): Payer: Self-pay

## 2020-06-06 ENCOUNTER — Other Ambulatory Visit: Payer: Self-pay | Admitting: Family Medicine

## 2020-06-17 ENCOUNTER — Encounter: Payer: Self-pay | Admitting: Obstetrics & Gynecology

## 2020-07-20 ENCOUNTER — Other Ambulatory Visit: Payer: Self-pay

## 2020-07-20 ENCOUNTER — Other Ambulatory Visit (HOSPITAL_COMMUNITY)
Admission: RE | Admit: 2020-07-20 | Discharge: 2020-07-20 | Disposition: A | Discharge status: Home / Self Care | Payer: BC Managed Care – PPO | Source: Ambulatory Visit | Attending: Obstetrics & Gynecology | Admitting: Obstetrics & Gynecology

## 2020-07-20 ENCOUNTER — Ambulatory Visit (INDEPENDENT_AMBULATORY_CARE_PROVIDER_SITE_OTHER): Payer: BC Managed Care – PPO | Admitting: Obstetrics & Gynecology

## 2020-07-20 ENCOUNTER — Encounter: Payer: Self-pay | Admitting: Obstetrics & Gynecology

## 2020-07-20 VITALS — BP 137/73 | HR 73 | Ht 69.0 in | Wt 196.0 lb

## 2020-07-20 DIAGNOSIS — E782 Mixed hyperlipidemia: Secondary | ICD-10-CM

## 2020-07-20 DIAGNOSIS — D72819 Decreased white blood cell count, unspecified: Secondary | ICD-10-CM

## 2020-07-20 DIAGNOSIS — Z124 Encounter for screening for malignant neoplasm of cervix: Secondary | ICD-10-CM

## 2020-07-20 DIAGNOSIS — I10 Essential (primary) hypertension: Secondary | ICD-10-CM

## 2020-07-20 DIAGNOSIS — Z01419 Encounter for gynecological examination (general) (routine) without abnormal findings: Secondary | ICD-10-CM | POA: Diagnosis not present

## 2020-07-20 NOTE — Progress Notes (Signed)
65 y.o. G0P0000 Divorced Black or [place] American female here for annual exam/gyn exam.  Has not had vaginal bleeding for at least 10 years.  She was never on HRT.  Reports she does still have some mild hot flashes even now but this is managable.    No LMP recorded. Patient is postmenopausal.          Sexually active: No.  The current method of family planning is post menopausal status.    Exercising: No.  walking Smoker:  no  Health Maintenance: Pap:  2011 History of abnormal Pap:  no MMG:  04/2020 Colonoscopy:  12/21/2016.  Follow up 5 years. BMD:   Plan with next MMG TDaP:  declines Pneumonia vaccine(s):  declines Shingrix:   declines Hep C testing: d/w pt today Screening Labs: 12/2019   reports that she has never smoked. She has never used smokeless tobacco. She reports that she does not drink alcohol and does not use drugs.  Past Medical History:  Diagnosis Date  . Allergic state 09/19/2013  . Allergy   . Angioedema   . Chicken pox   . Epistaxis 01/15/2013  . Hyperlipidemia 10/08/2016  . Hypertension   . Leukopenia 12/06/2014  . Measles   . Mumps   . Overweight 08/15/2007   Qualifier: Diagnosis of  By: Nena Jordan     No past surgical history on file.  Current Outpatient Medications  Medication Sig Dispense Refill  . amLODipine (NORVASC) 10 MG tablet Take 1 tablet (10 mg total) by mouth daily. 90 tablet 1  . carvedilol (COREG) 6.25 MG tablet TAKE 1 TABLET(6.25 MG) BY MOUTH TWICE DAILY WITH A MEAL 180 tablet 1  . Multiple Vitamins-Minerals (MEGA MULTIVITAMIN FOR WOMEN PO) Take 1 tablet by mouth daily. Reported on 10/20/2015    . levocetirizine (XYZAL) 5 MG tablet Take 1 tablet (5 mg total) by mouth every evening. (Patient not taking: No sig reported) 30 tablet 0  . Omega-3 Fatty Acids (FISH OIL) 1000 MG CAPS Take 1 capsule by mouth daily. (Patient not taking: No sig reported)     No current facility-administered medications for this visit.    Family History   Problem Relation Age of Onset  . Diabetes Mother 63  . Kidney disease Mother   . Heart disease Mother   . Cancer Father        pancreatic  . Pancreatic cancer Father   . Hypertension Brother   . Diabetes Brother   . Hypertension Sister   . Diabetes Paternal Grandmother   . Stroke Paternal Grandfather   . Hypertension Sister   . Hypertension Sister   . Hypertension Sister   . Hypertension Other        multiple  . Colon cancer Neg Hx   . Esophageal cancer Neg Hx   . Rectal cancer Neg Hx   . Stomach cancer Neg Hx     Review of Systems  Psychiatric/Behavioral: The patient is nervous/anxious (about exam today).   All other systems reviewed and are negative.   Exam:   BP 137/73   Pulse 73   Ht 5\' 9"  (1.753 m)   Wt 196 lb (88.9 kg)   BMI 28.94 kg/m   Height: 5\' 9"  (175.3 cm)  General appearance: alert, cooperative and appears stated age Head: Normocephalic, without obvious abnormality, atraumatic Neck: no adenopathy, supple, symmetrical, trachea midline and thyroid normal to inspection and palpation Lungs: clear to auscultation bilaterally Breasts: normal appearance, no masses or tenderness Heart:  regular rate and rhythm Abdomen: soft, non-tender; bowel sounds normal; no masses,  no organomegaly Extremities: extremities normal, atraumatic, no cyanosis or edema Skin: Skin color, texture, turgor normal. No rashes or lesions Lymph nodes: Cervical, supraclavicular, and axillary nodes normal. No abnormal inguinal nodes palpated Neurologic: Grossly normal   Pelvic: External genitalia:  no lesions              Urethra:  normal appearing urethra with no masses, tenderness or lesions              Bartholins and Skenes: normal                 Vagina: normal appearing vagina with normal color and discharge, no lesions              Cervix: no lesions              Pap taken: Yes.   Bimanual Exam:  Uterus:  normal size, contour, position, consistency, mobility, non-tender               Adnexa: normal adnexa and no mass, fullness, tenderness               Rectovaginal: Confirms               Anus:  normal sphincter tone, no lesions  Chaperone, Lorelle Gibbs, CMA, was present for exam.  Assessment/Plan: 1. Well woman exam with routine gynecological exam - Cytology - PAP( [facility]) - Colonoscopy 12/21/2016 - MMG 04/2020 - BMD plan with next MMG - Screening lab work done 12/2019  2. Essential hypertension - on Amlodipine and Carvedilol - followed by Dr. Rogelia Rohrer  3. Leukopenia, unspecified type - Last CBC with WBC ct 3.3.   4. Mixed hyperlipidemia

## 2020-07-22 LAB — CYTOLOGY - PAP
Comment: NEGATIVE
Diagnosis: NEGATIVE
High risk HPV: NEGATIVE

## 2020-09-02 ENCOUNTER — Other Ambulatory Visit: Payer: Self-pay | Admitting: Family Medicine

## 2020-12-01 ENCOUNTER — Other Ambulatory Visit: Payer: Self-pay | Admitting: Family Medicine

## 2020-12-31 ENCOUNTER — Other Ambulatory Visit: Payer: Self-pay | Admitting: Family Medicine

## 2021-03-17 ENCOUNTER — Encounter: Payer: Self-pay | Admitting: Family Medicine

## 2021-03-17 ENCOUNTER — Other Ambulatory Visit: Payer: Self-pay

## 2021-03-17 ENCOUNTER — Ambulatory Visit (INDEPENDENT_AMBULATORY_CARE_PROVIDER_SITE_OTHER): Payer: BC Managed Care – PPO | Admitting: Family Medicine

## 2021-03-17 VITALS — BP 122/76 | HR 82 | Temp 98.2°F | Resp 16 | Ht 69.0 in | Wt 199.0 lb

## 2021-03-17 DIAGNOSIS — I10 Essential (primary) hypertension: Secondary | ICD-10-CM

## 2021-03-17 DIAGNOSIS — Z0000 Encounter for general adult medical examination without abnormal findings: Secondary | ICD-10-CM | POA: Diagnosis not present

## 2021-03-17 DIAGNOSIS — E785 Hyperlipidemia, unspecified: Secondary | ICD-10-CM

## 2021-03-17 NOTE — Progress Notes (Signed)
Patient ID: Amber [name], female    DOB: 11-27-1955  Age: 65 y.o. MRN: [id number]    Subjective:   Chief Complaint  Patient presents with   Annual Exam    Pt has no concerns.   Subjective  HPI Amber [name] presents for office visit today for comprehensive physical exam today and follow up on management of chronic concerns. She is doing well and has no recent febrile illnesses or ER visits to report. Denies CP/palp/SOB/HA/congestion/fevers/GI or GU c/o. Taking meds as prescribed. She endorses physical exercise.   Review of Systems  Constitutional:  Negative for chills, fatigue and fever.  HENT:  Negative for congestion, rhinorrhea, sinus pressure, sinus pain, sore throat and trouble swallowing.   Eyes:  Negative for pain.  Respiratory:  Negative for cough and shortness of breath.   Cardiovascular:  Negative for chest pain, palpitations and leg swelling.  Gastrointestinal:  Negative for abdominal pain, blood in stool, diarrhea, nausea and vomiting.  Genitourinary:  Negative for decreased urine volume, flank pain, frequency, vaginal bleeding and vaginal discharge.  Musculoskeletal:  Negative for back pain.  Neurological:  Negative for headaches.   History Past Medical History:  Diagnosis Date   Allergic state 09/19/2013   Allergy    Angioedema    Chicken pox    Epistaxis 01/15/2013   Hyperlipidemia 10/08/2016   Hypertension    Leukopenia 12/06/2014   Measles    Mumps    Overweight 08/15/2007   Qualifier: Diagnosis of  By: Nena Jordan     She has no past surgical history on file.   Her family history includes Cancer in her father; Diabetes in her brother and paternal grandmother; Diabetes (age of onset: 73) in her mother; Heart disease in her mother; Hypertension in her brother, sister, sister, sister, sister, and another family member; Kidney disease in her mother; Pancreatic cancer in her father; Stroke in her paternal grandfather.She reports that she has never  smoked. She has never used smokeless tobacco. She reports that she does not drink alcohol and does not use drugs.  Current Outpatient Medications on File Prior to Visit  Medication Sig Dispense Refill   amLODipine (NORVASC) 10 MG tablet Take 1 tablet (10 mg total) by mouth daily. 90 tablet 1   carvedilol (COREG) 6.25 MG tablet TAKE 1 TABLET(6.25 MG) BY MOUTH TWICE DAILY WITH A MEAL 180 tablet 0   Multiple Vitamins-Minerals (MEGA MULTIVITAMIN FOR WOMEN PO) Take 1 tablet by mouth daily. Reported on 10/20/2015     No current facility-administered medications on file prior to visit.     Objective:  Objective  Physical Exam Constitutional:      General: She is not in acute distress.    Appearance: Normal appearance. She is not ill-appearing or toxic-appearing.  HENT:     Head: Normocephalic and atraumatic.     Right Ear: Tympanic membrane, ear canal and external ear normal.     Left Ear: Tympanic membrane, ear canal and external ear normal.     Nose: No congestion or rhinorrhea.  Eyes:     Extraocular Movements: Extraocular movements intact.     Right eye: No nystagmus.     Left eye: No nystagmus.     Pupils: Pupils are equal, round, and reactive to light.  Cardiovascular:     Rate and Rhythm: Normal rate and regular rhythm.     Pulses: Normal pulses.     Heart sounds: Normal heart sounds. No murmur heard. Pulmonary:  Effort: Pulmonary effort is normal. No respiratory distress.     Breath sounds: Normal breath sounds. No wheezing, rhonchi or rales.  Abdominal:     General: Bowel sounds are normal.     Palpations: Abdomen is soft. There is no mass.     Tenderness: There is no abdominal tenderness. There is no guarding.     Hernia: No hernia is present.  Musculoskeletal:        General: Normal range of motion.     Cervical back: Normal range of motion and neck supple.  Skin:    General: Skin is warm and dry.  Neurological:     Mental Status: She is alert and oriented to person,  place, and time.     Cranial Nerves: No facial asymmetry.     Motor: Motor function is intact. No weakness.     Deep Tendon Reflexes:     Reflex Scores:      Patellar reflexes are 2+ on the right side and 2+ on the left side. Psychiatric:        Behavior: Behavior normal.   BP 122/76   Pulse 82   Temp 98.2 F (36.8 C)   Resp 16   Ht 5\' 9"  (1.753 m)   Wt 199 lb (90.3 kg)   SpO2 99%   BMI 29.39 kg/m  Wt Readings from Last 3 Encounters:  03/17/21 199 lb (90.3 kg)  07/20/20 196 lb (88.9 kg)  01/06/20 190 lb (86.2 kg)     Lab Results  Component Value Date   WBC 3.3 (L) 01/06/2020   HGB 13.7 01/06/2020   HCT 40.9 01/06/2020   PLT 238.0 01/06/2020   GLUCOSE 92 01/06/2020   CHOL 171 01/06/2020   TRIG 36.0 01/06/2020   HDL 79.40 01/06/2020   LDLCALC 84 01/06/2020   ALT 16 01/06/2020   AST 19 01/06/2020   NA 141 01/06/2020   K 4.4 01/06/2020   CL 108 01/06/2020   CREATININE 0.86 01/06/2020   BUN 11 01/06/2020   CO2 28 01/06/2020   TSH 2.44 07/04/2018    No results found.   Assessment & Plan:  Plan    No orders of the defined types were placed in this encounter.   Problem List Items Addressed This Visit     Essential hypertension    Well controlled, no changes to meds. Encouraged heart healthy diet such as the DASH diet and exercise as tolerated.       Preventative health care    Patient encouraged to maintain heart healthy diet, regular exercise, adequate sleep. Consider daily probiotics. Take medications as prescribed. Labs ordered and reviewed. Despite extensive conversation patient declines all immunizations. No Tdap, COVID or flu shot. She had her last colonoscopy in 2018 at [facility] in [place] and reports  No polyps we will request a copy of report and confirm plan for f/u. Last [facility] 11/21 she will repeat later this year      Relevant Orders   CBC with Differential/Platelet   Comprehensive metabolic panel   Lipid panel   TSH   Other Visit  Diagnoses     Wellness examination    -  Primary   Relevant Orders   CBC with Differential/Platelet   Comprehensive metabolic panel   Lipid panel   TSH       Follow-up: Return in about 1 year (around 03/17/2022) for annual exam.  I, 03/19/2022, acting as a scribe for Billie Lade, MD, have documented all relevent  documentation on behalf of Danise Edge, MD, as directed by Danise Edge, MD while in the presence of Danise Edge, MD. DO:03/18/21.  I, Bradd Canary, MD personally performed the services described in this documentation. All medical record entries made by the scribe were at my direction and in my presence. I have reviewed the chart and agree that the record reflects my personal performance and is accurate and complete

## 2021-03-17 NOTE — Patient Instructions (Addendum)
Recommend calcium intake of 1200 to 1500 mg daily, divided into roughly 3 doses. Best source is the diet and a single dairy serving is about 500 mg, a supplement of calcium citrate once or twice daily to balance diet is fine if not getting enough in diet. Also need Vitamin D 2000 IU caps, 1 cap daily if not already taking vitamin D. Also recommend weight baring exercise on hips and upper body to keep bones strong   Shingrix is the new shingles shot, 2 shots over 2-6 months, confirm coverage with insurance and document, then can return here for shots with nurse appt or at pharmacy  Can check and see if they pay for Tdap and let us know if you want appt to receive or go to pharmacy  Hep C one time check, HIV checks with    Paxlovid and Molnupiravir is the new COVID medication we can give you if you get COVID so make sure you test if you have symptoms because we have to treat by day 5 of symptoms for it to be effective. If you are positive let us know so we can treat. If a home test is negative and your symptoms are persistent get a PCR test. Can check testing locations at [email or web address] If you are positive we will make an appointment with [place] and we will send in Paxlovid if you would like it. Check with your pharmacy before we meet to confirm they have it in stock, if they do not then we can get the prescription at the [facility]   Preventive Care 19-10 Years Old, Female Preventive care refers to lifestyle choices and visits with your health care provider that can promote health and wellness. This includes: A yearly physical exam. This is also called an annual wellness visit. Regular dental and eye exams. Immunizations. Screening for certain conditions. Healthy lifestyle choices, such as: Eating a healthy diet. Getting regular exercise. Not using drugs or products that contain nicotine and tobacco. Limiting alcohol use. What can I expect for my preventive care visit? Physical  exam Your health care provider will check your: Height and weight. These may be used to calculate your BMI (body mass index). BMI is a measurement that tells if you are at a healthy weight. Heart rate and blood pressure. Body temperature. Skin for abnormal spots. Counseling Your health care provider may ask you questions about your: Past medical problems. Family's medical history. Alcohol, tobacco, and drug use. Emotional well-being. Home life and relationship well-being. Sexual activity. Diet, exercise, and sleep habits. Work and work Statistician. Access to firearms. Method of birth control. Menstrual cycle. Pregnancy history. What immunizations do I need? Vaccines are usually given at various ages, according to a schedule. Your health care provider will recommend vaccines for you based on your age, medical history, and lifestyle or other factors, such as travel or where you work. What tests do I need? Blood tests Lipid and cholesterol levels. These may be checked every 5 years, or more often if you are over 83 years old. Hepatitis C test. Hepatitis B test. Screening Lung cancer screening. You may have this screening every year starting at age 12 if you have a 30-pack-year history of smoking and currently smoke or have quit within the past 15 years. Colorectal cancer screening. All adults should have this screening starting at age 88 and continuing until age 21. Your health care provider may recommend screening at age 32 if you are at increased risk. You will  have tests every 1-10 years, depending on your results and the type of screening test. Diabetes screening. This is done by checking your blood sugar (glucose) after you have not eaten for a while (fasting). You may have this done every 1-3 years. Mammogram. This may be done every 1-2 years. Talk with your health care provider about when you should start having regular mammograms. This may depend on whether you have a  family history of breast cancer. BRCA-related cancer screening. This may be done if you have a family history of breast, ovarian, tubal, or peritoneal cancers. Pelvic exam and Pap test. This may be done every 3 years starting at age 87. Starting at age 39, this may be done every 5 years if you have a Pap test in combination with an HPV test. Other tests STD (sexually transmitted disease) testing, if you are at risk. Bone density scan. This is done to screen for osteoporosis. You may have this scan if you are at high risk for osteoporosis. Talk with your health care provider about your test results, treatment options, and if necessary, the need for more tests. Follow these instructions at home: Eating and drinking  Eat a diet that includes fresh fruits and vegetables, whole grains, lean protein, and low-fat dairy products. Take vitamin and mineral supplements as recommended by your health care provider. Do not drink alcohol if: Your health care provider tells you not to drink. You are pregnant, may be pregnant, or are planning to become pregnant. If you drink alcohol: Limit how much you have to 0-1 drink a day. Be aware of how much alcohol is in your drink. In the [place] one drink equals one 12 oz bottle of beer (355 mL), one 5 oz glass of wine (148 mL), or one 1 oz glass of hard liquor (44 mL). Lifestyle Take daily care of your teeth and gums. Brush your teeth every morning and night with fluoride toothpaste. Floss one time each day. Stay active. Exercise for at least 30 minutes 5 or more days each week. Do not use any products that contain nicotine or tobacco, such as cigarettes, e-cigarettes, and chewing tobacco. If you need help quitting, ask your health care provider. Do not use drugs. If you are sexually active, practice safe sex. Use a condom or other form of protection to prevent STIs (sexually transmitted infections). If you do not wish to become pregnant, use a form of birth  control. If you plan to become pregnant, see your health care provider for a prepregnancy visit. If told by your health care provider, take low-dose aspirin daily starting at age 94. Find healthy ways to cope with stress, such as: Meditation, yoga, or listening to music. Journaling. Talking to a trusted person. Spending time with friends and family. Safety Always wear your seat belt while driving or riding in a vehicle. Do not drive: If you have been drinking alcohol. Do not ride with someone who has been drinking. When you are tired or distracted. While texting. Wear a helmet and other protective equipment during sports activities. If you have firearms in your house, make sure you follow all gun safety procedures. What's next? Visit your health care provider once a year for an annual wellness visit. Ask your health care provider how often you should have your eyes and teeth checked. Stay up to date on all vaccines. This information is not intended to replace advice given to you by your health care provider. Make sure you discuss any questions you  have with your health care provider. Document Revised: 08/13/2020 Document Reviewed: 02/14/2018 Elsevier Patient Education  2022 [employer].

## 2021-03-18 LAB — LIPID PANEL
Cholesterol: 177 mg/dL (ref 0–200)
HDL: 78.3 mg/dL (ref >39.00)
LDL Cholesterol: 86 mg/dL (ref 0–99)
NonHDL: 98.29
Total CHOL/HDL Ratio: 2
Triglycerides: 61 mg/dL (ref 0.0–149.0)
VLDL: 12.2 mg/dL (ref 0.0–40.0)

## 2021-03-18 LAB — COMPREHENSIVE METABOLIC PANEL
ALT: 13 U/L (ref 0–35)
AST: 17 U/L (ref 0–37)
Albumin: 4.3 g/dL (ref 3.5–5.2)
Alkaline Phosphatase: 87 U/L (ref 39–117)
BUN: 15 mg/dL (ref 6–23)
CO2: 27 mEq/L (ref 19–32)
Calcium: 9.5 mg/dL (ref 8.4–10.5)
Chloride: 106 mEq/L (ref 96–112)
Creatinine, Ser: 0.94 mg/dL (ref 0.40–1.20)
GFR: 64.37 mL/min (ref >60.00)
Glucose, Bld: 81 mg/dL (ref 70–99)
Potassium: 4.7 mEq/L (ref 3.5–5.1)
Sodium: 140 mEq/L (ref 135–145)
Total Bilirubin: 1 mg/dL (ref 0.2–1.2)
Total Protein: 7.4 g/dL (ref 6.0–8.3)

## 2021-03-18 LAB — CBC WITH DIFFERENTIAL/PLATELET
Basophils Absolute: 0 10*3/uL (ref 0.0–0.1)
Basophils Relative: 1 % (ref 0.0–3.0)
Eosinophils Absolute: 0.2 10*3/uL (ref 0.0–0.7)
Eosinophils Relative: 3.9 % (ref 0.0–5.0)
HCT: 41.4 % (ref 36.0–46.0)
Hemoglobin: 13.5 g/dL (ref 12.0–15.0)
Lymphocytes Relative: 41.7 % (ref 12.0–46.0)
Lymphs Abs: 1.6 10*3/uL (ref 0.7–4.0)
MCHC: 32.6 g/dL (ref 30.0–36.0)
MCV: 85.6 fl (ref 78.0–100.0)
Monocytes Absolute: 0.4 10*3/uL (ref 0.1–1.0)
Monocytes Relative: 11.2 % (ref 3.0–12.0)
Neutro Abs: 1.6 10*3/uL (ref 1.4–7.7)
Neutrophils Relative %: 42.2 % — ABNORMAL LOW (ref 43.0–77.0)
Platelets: 252 10*3/uL (ref 150.0–400.0)
RBC: 4.83 Mil/uL (ref 3.87–5.11)
RDW: 14.7 % (ref 11.5–15.5)
WBC: 3.9 10*3/uL — ABNORMAL LOW (ref 4.0–10.5)

## 2021-03-18 LAB — TSH: TSH: 2.1 u[IU]/mL (ref 0.35–5.50)

## 2021-03-18 NOTE — Assessment & Plan Note (Signed)
Patient encouraged to maintain heart healthy diet, regular exercise, adequate sleep. Consider daily probiotics. Take medications as prescribed. Labs ordered and reviewed. Despite extensive conversation patient declines all immunizations. No Tdap, COVID or flu shot. She had her last colonoscopy in 2018 at [facility] in [place] and reports  No polyps we will request a copy of report and confirm plan for f/u. Last [facility] 11/21 she will repeat later this year

## 2021-03-18 NOTE — Assessment & Plan Note (Signed)
Well controlled, no changes to meds. Encouraged heart healthy diet such as the DASH diet and exercise as tolerated.  °

## 2021-03-21 ENCOUNTER — Encounter: Payer: Self-pay | Admitting: *Deleted

## 2021-03-27 ENCOUNTER — Other Ambulatory Visit: Payer: Self-pay | Admitting: Family Medicine

## 2021-06-06 ENCOUNTER — Other Ambulatory Visit: Payer: Self-pay | Admitting: Family Medicine

## 2021-09-15 ENCOUNTER — Ambulatory Visit: Payer: BC Managed Care – PPO | Admitting: Family Medicine

## 2021-10-26 NOTE — Progress Notes (Signed)
? ?Subjective:  ? ? Patient ID: Amber [name], female    DOB: 07-26-1955, 66 y.o.   MRN: [id number] ? ?Chief Complaint  ?Patient presents with  ? Hypertension  ? ? ?HPI ?Patient is in today for a hypertension follow up. No recent febrile illness or acute hospitalizations. No acute concerns. She is trying to stay active and maintain a heart healthy diet. Denies CP/palp/SOB/HA/fevers/GI or GU c/o. Taking meds as prescribed. Her allergies have been flared since February and Claritin has been helpful. No fevers or chills.  ? ?Past Medical History:  ?Diagnosis Date  ? Allergic state 09/19/2013  ? Allergy   ? Angioedema   ? Chicken pox   ? Epistaxis 01/15/2013  ? Hyperlipidemia 10/08/2016  ? Hypertension   ? Leukopenia 12/06/2014  ? Measles   ? Mumps   ? Overweight 08/15/2007  ? Qualifier: Diagnosis of  By: Wynona Luna   ? ? ?History reviewed. No pertinent surgical history. ? ?Family History  ?Problem Relation Age of Onset  ? Diabetes Mother 74  ? Kidney disease Mother   ? Heart disease Mother   ? Cancer Father   ?     pancreatic  ? Pancreatic cancer Father   ? Hypertension Brother   ? Diabetes Brother   ? Hypertension Sister   ? Diabetes Paternal Grandmother   ? Stroke Paternal Grandfather   ? Hypertension Sister   ? Hypertension Sister   ? Hypertension Sister   ? Hypertension Other   ?     multiple  ? Colon cancer Neg Hx   ? Esophageal cancer Neg Hx   ? Rectal cancer Neg Hx   ? Stomach cancer Neg Hx   ? ? ?Social History  ? ?Socioeconomic History  ? Marital status: Divorced  ?  Spouse name: Not on file  ? Number of children: Not on file  ? Years of education: Not on file  ? Highest education level: Not on file  ?Occupational History  ? Not on file  ?Tobacco Use  ? Smoking status: Never  ? Smokeless tobacco: Never  ?Substance and Sexual Activity  ? Alcohol use: No  ? Drug use: No  ? Sexual activity: Not Currently  ?  Comment: no dietary restrictions, lives alone, insurance work  ?Other Topics Concern  ? Not on file   ?Social History Narrative  ? Not on file  ? ?Social Determinants of Health  ? ?Financial Resource Strain: Not on file  ?Food Insecurity: Not on file  ?Transportation Needs: Not on file  ?Physical Activity: Not on file  ?Stress: Not on file  ?Social Connections: Not on file  ?Intimate Partner Violence: Not on file  ? ? ?Outpatient Medications Prior to Visit  ?Medication Sig Dispense Refill  ? amLODipine (NORVASC) 10 MG tablet TAKE 1 TABLET(10 MG) BY MOUTH DAILY 90 tablet 1  ? carvedilol (COREG) 6.25 MG tablet TAKE 1 TABLET(6.25 MG) BY MOUTH TWICE DAILY WITH A MEAL 180 tablet 1  ? Multiple Vitamins-Minerals (MEGA MULTIVITAMIN FOR WOMEN PO) Take 1 tablet by mouth daily. Reported on 10/20/2015    ? ?No facility-administered medications prior to visit.  ? ? ?Allergies  ?Allergen Reactions  ? Mixed Ragweed   ? ? ?Review of Systems  ?Constitutional:  Negative for fever and malaise/fatigue.  ?HENT:  Positive for congestion. Negative for sinus pain.   ?Eyes:  Negative for blurred vision.  ?Respiratory:  Positive for sputum production. Negative for shortness of breath.   ?Cardiovascular:  Negative for chest pain, palpitations and leg swelling.  ?Gastrointestinal:  Negative for abdominal pain, blood in stool and nausea.  ?Genitourinary:  Negative for dysuria and frequency.  ?Musculoskeletal:  Negative for falls.  ?Skin:  Negative for rash.  ?Neurological:  Negative for dizziness, loss of consciousness and headaches.  ?Endo/Heme/Allergies:  Negative for environmental allergies.  ?Psychiatric/Behavioral:  Negative for depression. The patient is not nervous/anxious.   ? ?   ?Objective:  ?  ?Physical Exam ?Constitutional:   ?   General: She is not in acute distress. ?   Appearance: She is well-developed.  ?HENT:  ?   Head: Normocephalic and atraumatic.  ?Eyes:  ?   Conjunctiva/sclera: Conjunctivae normal.  ?Neck:  ?   Thyroid: No thyromegaly.  ?Cardiovascular:  ?   Rate and Rhythm: Normal rate and regular rhythm.  ?   Heart sounds:  Normal heart sounds. No murmur heard. ?Pulmonary:  ?   Effort: Pulmonary effort is normal. No respiratory distress.  ?   Breath sounds: Normal breath sounds.  ?Abdominal:  ?   General: Bowel sounds are normal. There is no distension.  ?   Palpations: Abdomen is soft. There is no mass.  ?   Tenderness: There is no abdominal tenderness.  ?Musculoskeletal:  ?   Cervical back: Neck supple.  ?Lymphadenopathy:  ?   Cervical: No cervical adenopathy.  ?Skin: ?   General: Skin is warm and dry.  ?Neurological:  ?   Mental Status: She is alert and oriented to person, place, and time.  ?Psychiatric:     ?   Behavior: Behavior normal.  ? ? ?BP (!) 142/80 (BP Location: Left Arm, Patient Position: Sitting, Cuff Size: Normal)   Pulse 87   Resp 20   Ht 5\' 9"  (1.753 m)   Wt 198 lb 6.4 oz (90 kg)   SpO2 99%   BMI 29.30 kg/m?  ?Wt Readings from Last 3 Encounters:  ?10/27/21 198 lb 6.4 oz (90 kg)  ?03/17/21 199 lb (90.3 kg)  ?07/20/20 196 lb (88.9 kg)  ? ? ?Diabetic Foot Exam - Simple   ?No data filed ?  ? ?Lab Results  ?Component Value Date  ? WBC 3.9 (L) 03/17/2021  ? HGB 13.5 03/17/2021  ? HCT 41.4 03/17/2021  ? PLT 252.0 03/17/2021  ? GLUCOSE 81 03/17/2021  ? CHOL 177 03/17/2021  ? TRIG 61.0 03/17/2021  ? HDL 78.30 03/17/2021  ? [place] 86 03/17/2021  ? ALT 13 03/17/2021  ? AST 17 03/17/2021  ? NA 140 03/17/2021  ? K 4.7 03/17/2021  ? CL 106 03/17/2021  ? CREATININE 0.94 03/17/2021  ? BUN 15 03/17/2021  ? CO2 27 03/17/2021  ? TSH 2.10 03/17/2021  ? ? ?Lab Results  ?Component Value Date  ? TSH 2.10 03/17/2021  ? ?Lab Results  ?Component Value Date  ? WBC 3.9 (L) 03/17/2021  ? HGB 13.5 03/17/2021  ? HCT 41.4 03/17/2021  ? MCV 85.6 03/17/2021  ? PLT 252.0 03/17/2021  ? ?Lab Results  ?Component Value Date  ? NA 140 03/17/2021  ? K 4.7 03/17/2021  ? CO2 27 03/17/2021  ? GLUCOSE 81 03/17/2021  ? BUN 15 03/17/2021  ? CREATININE 0.94 03/17/2021  ? BILITOT 1.0 03/17/2021  ? ALKPHOS 87 03/17/2021  ? AST 17 03/17/2021  ? ALT 13  03/17/2021  ? PROT 7.4 03/17/2021  ? ALBUMIN 4.3 03/17/2021  ? CALCIUM 9.5 03/17/2021  ? GFR 64.37 03/17/2021  ? ?Lab Results  ?Component Value Date  ?  CHOL 177 03/17/2021  ? ?Lab Results  ?Component Value Date  ? HDL 78.30 03/17/2021  ? ?Lab Results  ?Component Value Date  ? [place] 86 03/17/2021  ? ?Lab Results  ?Component Value Date  ? TRIG 61.0 03/17/2021  ? ?Lab Results  ?Component Value Date  ? CHOLHDL 2 03/17/2021  ? ?No results found for: HGBA1C ? ?   ?Assessment & Plan:  ? ?Problem List Items Addressed This Visit   ? ? Essential hypertension - Primary  ?  Well controlled, no changes to meds. Encouraged heart healthy diet such as the DASH diet and exercise as tolerated. Will have her monitor at home and report if her numbers remain greater than 140/90 at rest at home ? ?  ?  ? ? ?I am having Sonda Primes maintain her Multiple Vitamins-Minerals (MEGA MULTIVITAMIN FOR WOMEN PO), carvedilol, and amLODipine. ? ?No orders of the defined types were placed in this encounter. ? ? ? ?

## 2021-10-27 ENCOUNTER — Encounter: Payer: Self-pay | Admitting: Family Medicine

## 2021-10-27 ENCOUNTER — Ambulatory Visit: Payer: BC Managed Care – PPO | Admitting: Family Medicine

## 2021-10-27 VITALS — BP 142/80 | HR 87 | Resp 20 | Ht 69.0 in | Wt 198.4 lb

## 2021-10-27 DIAGNOSIS — I10 Essential (primary) hypertension: Secondary | ICD-10-CM | POA: Diagnosis not present

## 2021-10-27 DIAGNOSIS — T7840XD Allergy, unspecified, subsequent encounter: Secondary | ICD-10-CM

## 2021-10-27 NOTE — Patient Instructions (Addendum)
100-140/60-90 ? ?Claritin 10 mg once to twice daily ?Flonase/Fluticasone nasal spray daily ?Astelin nasal spray twice daily ? ?Prescription Singulair/Montelukast daily for allergies ? ?Allergic Rhinitis, Adult ? ?Allergic rhinitis is an allergic reaction that affects the mucous membrane inside the nose. The mucous membrane is the tissue that produces mucus. ?There are two types of allergic rhinitis: ?Seasonal. This type is also called hay fever and happens only during certain seasons. ?Perennial. This type can happen at any time of the year. ?Allergic rhinitis cannot be spread from person to person. This condition can be mild, moderate, or severe. It can develop at any age and may be outgrown. ?What are the causes? ?This condition is caused by allergens. These are things that can cause an allergic reaction. Allergens may differ for seasonal allergic rhinitis and perennial allergic rhinitis. ?Seasonal allergic rhinitis is triggered by pollen. Pollen can come from grasses, trees, and weeds. ?Perennial allergic rhinitis may be triggered by: ?Dust mites. ?Proteins in a pet's urine, saliva, or dander. Dander is dead skin cells from a pet. ?Smoke, mold, or car fumes. ?What increases the risk? ?You are more likely to develop this condition if you have a family history of allergies or other conditions related to allergies, including: ?Allergic conjunctivitis. This is inflammation of parts of the eyes and eyelids. ?Asthma. This condition affects the lungs and makes it hard to breathe. ?Atopic dermatitis or eczema. This is long term (chronic) inflammation of the skin. ?Food allergies. ?What are the signs or symptoms? ?Symptoms of this condition include: ?Sneezing or coughing. ?A stuffy nose (nasal congestion), itchy nose, or nasal discharge. ?Itchy eyes and tearing of the eyes. ?A feeling of mucus dripping down the back of your throat (postnasal drip). ?Trouble sleeping. ?Tiredness or fatigue. ?Headache. ?Sore throat. ?How is  this diagnosed? ?This condition may be diagnosed with your symptoms, medical history, and physical exam. Your health care provider may check for related conditions, such as: ?Asthma. ?Pink eye. This is eye inflammation caused by infection (conjunctivitis). ?Ear infection. ?Upper respiratory infection. This is an infection in the nose, throat, or upper airways. ?You may also have tests to find out which allergens trigger your symptoms. These may include skin tests or blood tests. ?How is this treated? ?There is no cure for this condition, but treatment can help control symptoms. Treatment may include: ?Taking medicines that block allergy symptoms, such as corticosteroids and antihistamines. Medicine may be given as a shot, nasal spray, or pill. ?Avoiding any allergens. ?Being exposed again and again to tiny amounts of allergens to help you build a defense against allergens (immunotherapy). This is done if other treatments have not helped. It may include: ?Allergy shots. These are injected medicines that have small amounts of allergen in them. ?Sublingual immunotherapy. This involves taking small doses of a medicine with allergen in it under your tongue. ?If these treatments do not work, your health care provider may prescribe newer, stronger medicines. ?Follow these instructions at home: ?Avoiding allergens ?Find out what you are allergic to and avoid those allergens. These are some things you can do to help avoid allergens: ?If you have perennial allergies: ?Replace carpet with wood, tile, or vinyl flooring. Carpet can trap dander and dust. ?Do not smoke. Do not allow smoking in your home. ?Change your heating and air conditioning filters at least once a month. ?If you have seasonal allergies, take these steps during allergy season: ?Keep windows closed as much as possible. ?Plan outdoor activities when pollen counts are lowest. Check  pollen counts before you plan outdoor activities. ?When coming indoors, change  clothing and shower before sitting on furniture or bedding. ?If you have a pet in the house that produces allergens: ?Keep the pet out of the bedroom. ?Vacuum, sweep, and dust regularly. ?General instructions ?Take over-the-counter and prescription medicines only as told by your health care provider. ?Drink enough fluid to keep your urine pale yellow. ?Keep all follow-up visits as told by your health care provider. This is important. ?Where to find more information ?American Academy of Allergy, Asthma & Immunology: www.aaaai.org ?Contact a health care provider if: ?You have a fever. ?You develop a cough that does not go away. ?You make whistling sounds when you breathe (wheeze). ?Your symptoms slow you down or stop you from doing your normal activities each day. ?Get help right away if: ?You have shortness of breath. ?This symptom may represent a serious problem that is an emergency. Do not wait to see if the symptom will go away. Get medical help right away. Call your local emergency services (911 in the [place] Do not drive yourself to the hospital. ?Summary ?Allergic rhinitis may be managed by taking medicines as directed and avoiding allergens. ?If you have seasonal allergies, keep windows closed as much as possible during allergy season. ?Contact your health care provider if you develop a fever or a cough that does not go away. ?This information is not intended to replace advice given to you by your health care provider. Make sure you discuss any questions you have with your health care provider. ?Document Revised: 07/25/2019 Document Reviewed: 06/03/2019 ?Elsevier Patient Education ? 2023 Elsevier Inc[employer] ? ?

## 2021-10-31 NOTE — Assessment & Plan Note (Signed)
Allergies have been flared since February. Claritin has been helpful. Can use it bid or add Flonase, Astelin and/or Singulair if it worsens. ?

## 2021-10-31 NOTE — Assessment & Plan Note (Signed)
Well controlled, no changes to meds. Encouraged heart healthy diet such as the DASH diet and exercise as tolerated. Will have her monitor at home and report if her numbers remain greater than 140/90 at rest at home ?

## 2021-11-09 ENCOUNTER — Telehealth: Payer: Self-pay | Admitting: Family Medicine

## 2021-11-09 MED ORDER — CARVEDILOL 6.25 MG PO TABS: ORAL_TABLET | ORAL | 1 refills | Status: DC

## 2021-11-09 NOTE — Telephone Encounter (Signed)
Refill sent.

## 2021-11-09 NOTE — Telephone Encounter (Signed)
Medication: carvedilol (COREG) 6.25 MG tablet   Has the patient contacted their pharmacy? Yes.   (If no, request that the patient contact the pharmacy for the refill.) (If yes, when and what did the pharmacy advise?)  Preferred Pharmacy (with phone number or street name):  [id number] - [place], [place] - [address] AT [facility] OF [address] & [address] [place] [address]  Phone:  [phone]  Fax:  [phone]   Agent: Please be advised that RX refills may take up to 3 business days. We ask that you follow-up with your pharmacy.

## 2021-11-21 ENCOUNTER — Other Ambulatory Visit: Payer: Self-pay

## 2021-11-21 MED ORDER — AMLODIPINE BESYLATE 10 MG PO TABS: ORAL_TABLET | ORAL | 1 refills | Status: DC

## 2021-12-05 ENCOUNTER — Other Ambulatory Visit: Payer: Self-pay

## 2021-12-05 IMAGING — MG DIGITAL SCREENING BILAT W/ TOMO W/ CAD
8 series · 8 of 24 positions shown · non-contrast
Comparison: Previous exam(s).

CLINICAL DATA: Screening.

EXAM:
DIGITAL SCREENING BILATERAL MAMMOGRAM WITH TOMO AND CAD

[L CC synth-2D]
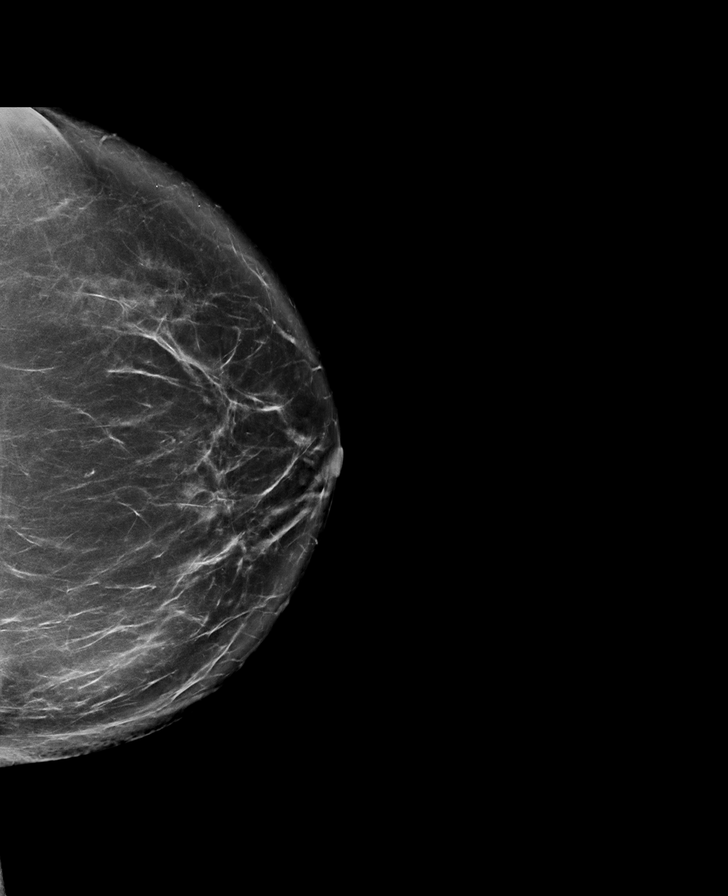

[R MLO synth-2D]
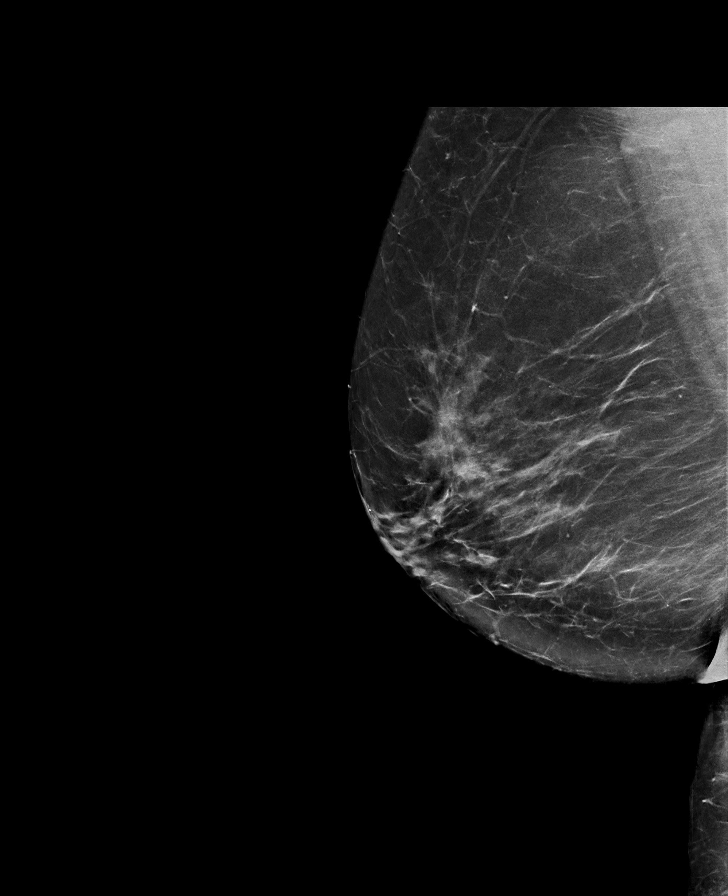

[L MLO synth-2D]
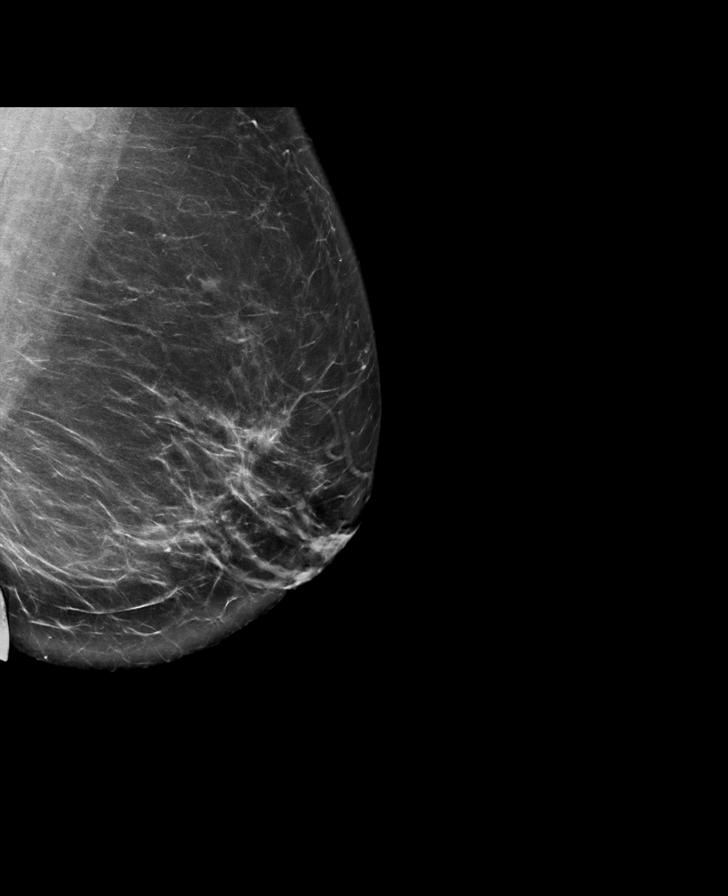

[R CC synth-2D]
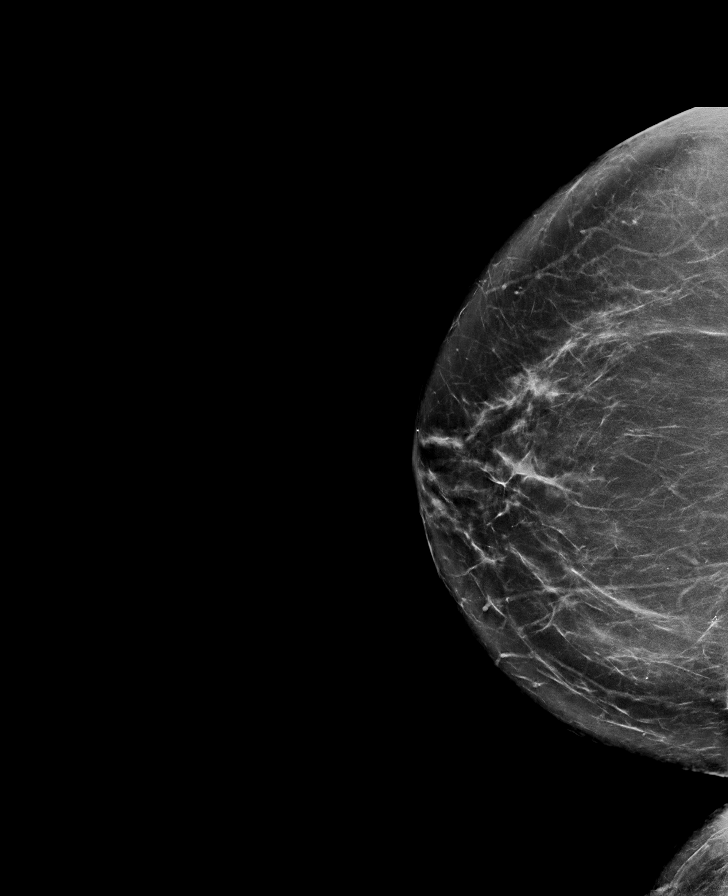

[L CC tomo · tomo slice 47/94.0]
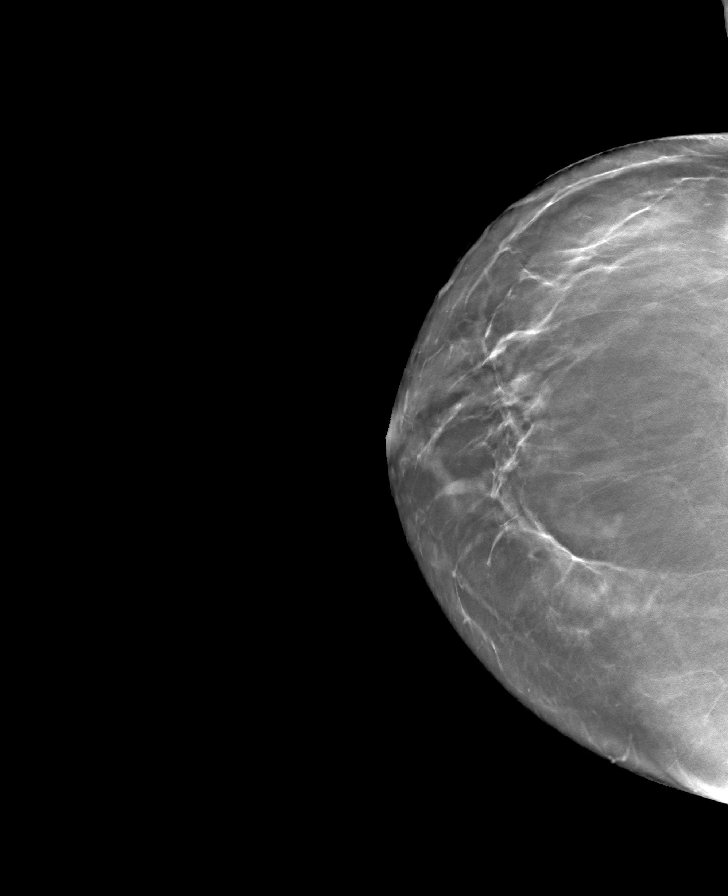

[L MLO tomo · tomo slice 49/97.0]
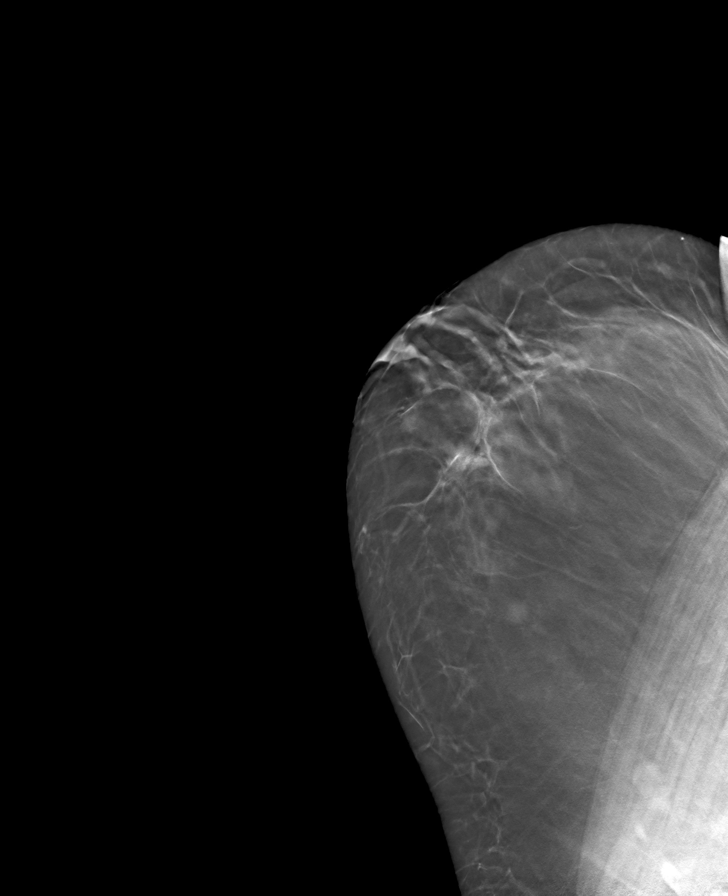

[R CC tomo · tomo slice 47/93.0]
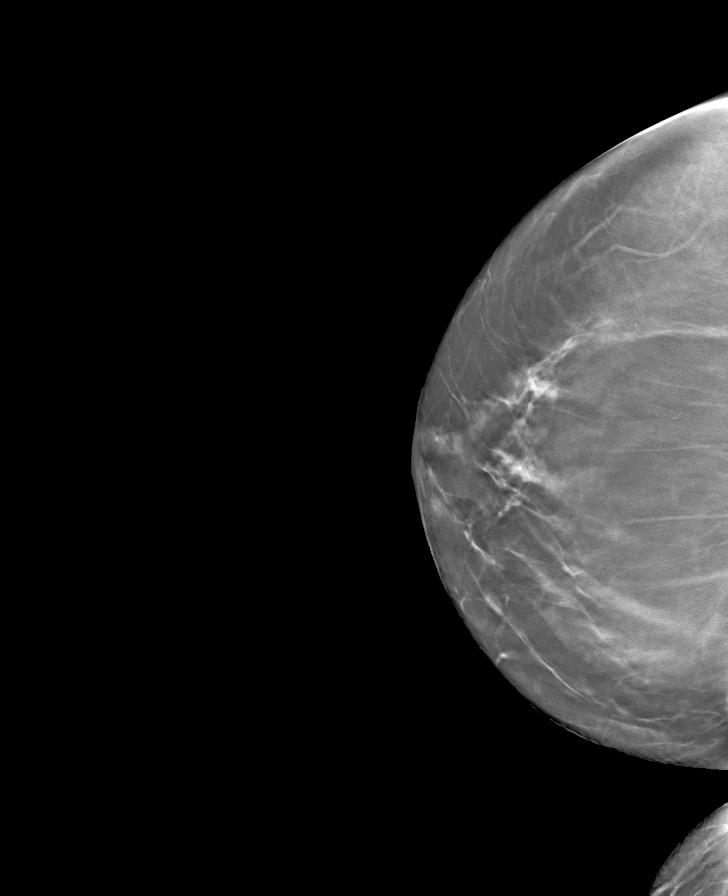

[R MLO tomo · tomo slice 47/94.0]
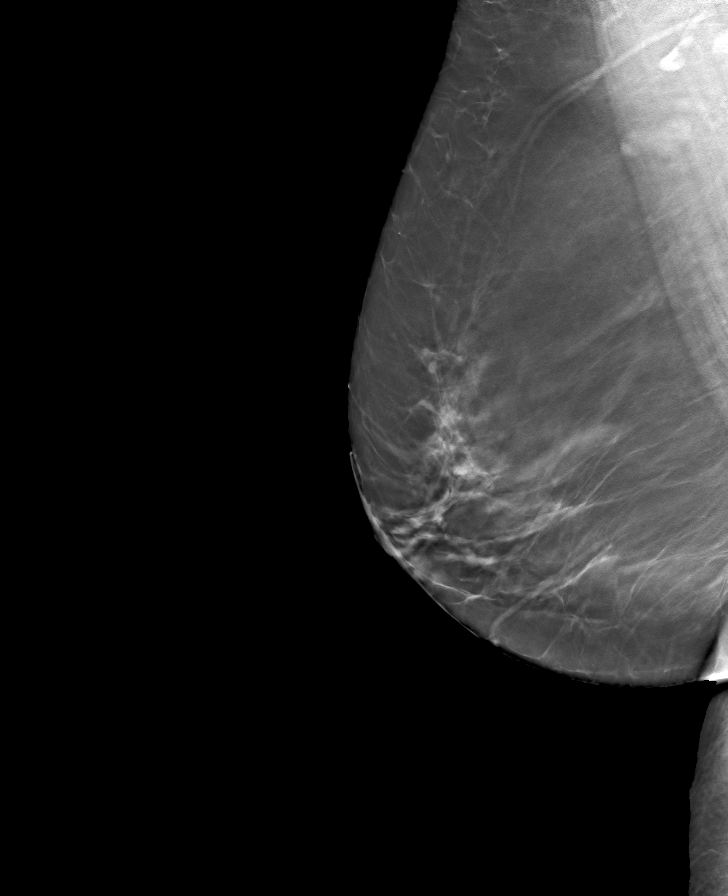

[8 of 24 positions shown; findings below may reference images not displayed]

ACR Breast Density Category b: There are scattered areas of
fibroglandular density.
FINDINGS: There are no findings suspicious for malignancy. Images were
processed with CAD.
IMPRESSION: No mammographic evidence of malignancy. A result letter of this
screening mammogram will be mailed directly to the patient.

RECOMMENDATION:
Screening mammogram in one year. (Code:[id number])

BI-RADS CATEGORY  1: Negative.

## 2021-12-05 MED ORDER — AMLODIPINE BESYLATE 10 MG PO TABS: ORAL_TABLET | ORAL | 1 refills | Status: DC

## 2022-03-20 NOTE — Assessment & Plan Note (Signed)
Encourage heart healthy diet such as MIND or DASH diet, increase exercise, avoid trans fats, simple carbohydrates and processed foods, consider a krill or fish or flaxseed oil cap daily.  °

## 2022-03-20 NOTE — Assessment & Plan Note (Signed)
Well controlled, no changes to meds. Encouraged heart healthy diet such as the DASH diet and exercise as tolerated.  °

## 2022-03-20 NOTE — Assessment & Plan Note (Addendum)
Patient encouraged to maintain heart healthy diet, regular exercise, adequate sleep. Consider daily probiotics. Take medications as prescribed. Labs ordered and reviewed Colonoscopy 2018 repeat in 2023 [facility] 04/2020 repeat this year,ordered Pap 07/2020 repeat in 2025-26 Dexa, needs, ordered today Patient declines immunizations RSV (respiratory syncitial virus) vaccine at pharmacy, Arexvy Covid booster  version out any time at pharmacy High dose flu shot in October Tetanus Prevnar 20 Shingrix is the new shingles shot, 2 shots over 2-6 months, confirm coverage with insurance and document, then can return here for shots with nurse appt or at pharmacy

## 2022-03-21 ENCOUNTER — Ambulatory Visit (INDEPENDENT_AMBULATORY_CARE_PROVIDER_SITE_OTHER): Payer: BC Managed Care – PPO | Admitting: Family Medicine

## 2022-03-21 ENCOUNTER — Encounter: Payer: Self-pay | Admitting: Family Medicine

## 2022-03-21 VITALS — BP 128/78 | HR 91 | Temp 98.0°F | Resp 16 | Ht 68.0 in | Wt 192.8 lb

## 2022-03-21 DIAGNOSIS — E2839 Other primary ovarian failure: Secondary | ICD-10-CM

## 2022-03-21 DIAGNOSIS — Z1239 Encounter for other screening for malignant neoplasm of breast: Secondary | ICD-10-CM | POA: Diagnosis not present

## 2022-03-21 DIAGNOSIS — E782 Mixed hyperlipidemia: Secondary | ICD-10-CM

## 2022-03-21 DIAGNOSIS — Z0000 Encounter for general adult medical examination without abnormal findings: Secondary | ICD-10-CM

## 2022-03-21 DIAGNOSIS — I10 Essential (primary) hypertension: Secondary | ICD-10-CM | POA: Diagnosis not present

## 2022-03-21 DIAGNOSIS — Z1211 Encounter for screening for malignant neoplasm of colon: Secondary | ICD-10-CM | POA: Diagnosis not present

## 2022-03-21 DIAGNOSIS — Z780 Asymptomatic menopausal state: Secondary | ICD-10-CM | POA: Diagnosis not present

## 2022-03-21 MED ORDER — AMLODIPINE BESYLATE 10 MG PO TABS: ORAL_TABLET | ORAL | 1 refills | Status: DC

## 2022-03-21 MED ORDER — CARVEDILOL 6.25 MG PO TABS: ORAL_TABLET | ORAL | 1 refills | Status: DC

## 2022-03-21 NOTE — Progress Notes (Signed)
Subjective:   By signing my name below, I, Kellie Simmering, attest that this documentation has been prepared under the direction and in the presence of Mosie Lukes, MD 03/21/2022.     Patient ID: Amber [name], female    DOB: [birth year], 66 y.o.   MRN: [id number]  Chief Complaint  Patient presents with   Annual Exam    Here for Cpe    HPI Patient is in today for a comprehensive physical exam and follow up on chronic medical concerns.  She denies having any fever, chills, ear pain, headaches, muscle pain, joint pain, new moles, rash, itching, congestion, sinus pain, sore throat, chest pain, palpitations, wheezing, nausea, vomitting, abdominal pain, diarrhea, constipation, blood in stool, dysuria, urgency, frequency and hematuria.   Blood Pressure: She is currently taking Amlodipine 10 mg and Carvedilol 6.25 mg to manage her blood pressure. Her blood pressure is within normal range today. She states that she does not check her blood pressure at home. BP Readings from Last 3 Encounters:  03/21/22 128/78  10/27/21 (!) 142/80  03/17/21 122/76   Dental: She is up to date on dental care.   Diet: She does well at maintaining a balanced diet.  Exercise: She walks to remain active.   Family history: She states that one sister has rheumatoid arthritis. She further states that two sisters and her only brother are pre-diabetic. Her mother was diabetic and passed away a 74 years old and her father passed away a 20 years old from pancreatic cancer.   Immunizations: She has been informed about receiving COVID-19, high-dose Flu, Pneumonia, RSV, Shingles, and Tetanus immunizations. However, she states that she only takes COVID-19 immunizations.   Sleep: She says that she sleeps for approximately 6 hours nightly.   Social History: She states that she does not drink alcohol or smoke cigarettes.   Past Medical History:  Diagnosis Date   Allergic state 09/19/2013   Allergy    Angioedema     Chicken pox    Epistaxis 01/15/2013   Hyperlipidemia 10/08/2016   Hypertension    Leukopenia 12/06/2014   Measles    Mumps    Overweight 08/15/2007   Qualifier: Diagnosis of  By: Wynona Luna    No past surgical history on file.  Family History  Problem Relation Age of Onset   Diabetes Mother 58   Kidney disease Mother    Heart disease Mother    Cancer Father        pancreatic   Pancreatic cancer Father    Arthritis Sister    Hypertension Sister    Hypertension Sister    Hypertension Sister    Hypertension Sister    Hypertension Brother    Diabetes Brother    Diabetes Paternal Grandmother    Stroke Paternal Grandfather    Hypertension Other        multiple   Colon cancer Neg Hx    Esophageal cancer Neg Hx    Rectal cancer Neg Hx    Stomach cancer Neg Hx    Social History   Socioeconomic History   Marital status: Divorced    Spouse name: Not on file   Number of children: Not on file   Years of education: Not on file   Highest education level: Not on file  Occupational History   Not on file  Tobacco Use   Smoking status: Never   Smokeless tobacco: Never  Substance and Sexual Activity   Alcohol use: No   Drug use:  No   Sexual activity: Not Currently    Comment: no dietary restrictions, lives alone, insurance work  Other Topics Concern   Not on file  Social History Narrative   Not on file   Social Determinants of Health   Financial Resource Strain: Not on file  Food Insecurity: Not on file  Transportation Needs: Not on file  Physical Activity: Not on file  Stress: Not on file  Social Connections: Not on file  Intimate Partner Violence: Not on file   Outpatient Medications Prior to Visit  Medication Sig Dispense Refill   Multiple Vitamins-Minerals (MEGA MULTIVITAMIN FOR WOMEN PO) Take 1 tablet by mouth daily. Reported on 10/20/2015     amLODipine (NORVASC) 10 MG tablet TAKE 1 TABLET(10 MG) BY MOUTH DAILY 90 tablet 1   carvedilol (COREG) 6.25 MG  tablet TAKE 1 TABLET(6.25 MG) BY MOUTH TWICE DAILY WITH A MEAL 180 tablet 1   No facility-administered medications prior to visit.   Allergies  Allergen Reactions   Mixed Ragweed    Review of Systems  Constitutional:  Negative for chills and fever.  HENT:  Negative for congestion, ear pain, sinus pain and sore throat.   Respiratory:  Negative for cough, shortness of breath and wheezing.   Cardiovascular:  Negative for chest pain and palpitations.  Gastrointestinal:  Negative for abdominal pain, blood in stool, constipation, diarrhea, nausea and vomiting.  Genitourinary:  Negative for dysuria, frequency, hematuria and urgency.  Musculoskeletal:  Negative for joint pain and myalgias.  Skin:  Negative for itching and rash.       (-) New moles.  Neurological:  Negative for headaches.      Objective:    Physical Exam Constitutional:      General: She is not in acute distress.    Appearance: Normal appearance. She is not ill-appearing.  HENT:     Head: Normocephalic and atraumatic.     Right Ear: Tympanic membrane, ear canal and external ear normal.     Left Ear: Tympanic membrane, ear canal and external ear normal.     Mouth/Throat:     Mouth: Mucous membranes are moist.     Pharynx: Oropharynx is clear.  Eyes:     Extraocular Movements: Extraocular movements intact.     Right eye: No nystagmus.     Left eye: No nystagmus.     Pupils: Pupils are equal, round, and reactive to light.  Neck:     Vascular: No carotid bruit.  Cardiovascular:     Rate and Rhythm: Normal rate and regular rhythm.     Pulses: Normal pulses.     Heart sounds: Normal heart sounds. No murmur heard.    No gallop.  Pulmonary:     Effort: Pulmonary effort is normal. No respiratory distress.     Breath sounds: Normal breath sounds. No wheezing or rales.  Abdominal:     General: Bowel sounds are normal.     Tenderness: There is no abdominal tenderness.  Musculoskeletal:     Comments: Muscle strength  5/5 on upper and lower extremities.   Lymphadenopathy:     Cervical: No cervical adenopathy.  Skin:    General: Skin is warm and dry.  Neurological:     Mental Status: She is alert and oriented to person, place, and time.     Sensory: Sensation is intact.     Motor: Motor function is intact.     Coordination: Coordination is intact.     Deep Tendon Reflexes:  Reflex Scores:      Patellar reflexes are 2+ on the right side and 2+ on the left side. Psychiatric:        Mood and Affect: Mood normal.        Behavior: Behavior normal.        Judgment: Judgment normal.    BP 128/78 (BP Location: Right Arm, Patient Position: Sitting, Cuff Size: Normal)   Pulse 91   Temp 98 F (36.7 C) (Oral)   Resp 16   Ht 5\' 8"  (1.727 m)   Wt 192 lb 12.8 oz (87.5 kg)   SpO2 98%   BMI 29.32 kg/m  Wt Readings from Last 3 Encounters:  03/21/22 192 lb 12.8 oz (87.5 kg)  10/27/21 198 lb 6.4 oz (90 kg)  03/17/21 199 lb (90.3 kg)   Diabetic Foot Exam - Simple   No data filed    Lab Results  Component Value Date   WBC 3.9 (L) 03/17/2021   HGB 13.5 03/17/2021   HCT 41.4 03/17/2021   PLT 252.0 03/17/2021   GLUCOSE 81 03/17/2021   CHOL 177 03/17/2021   TRIG 61.0 03/17/2021   HDL 78.30 03/17/2021   LDLCALC 86 03/17/2021   ALT 13 03/17/2021   AST 17 03/17/2021   NA 140 03/17/2021   K 4.7 03/17/2021   CL 106 03/17/2021   CREATININE 0.94 03/17/2021   BUN 15 03/17/2021   CO2 27 03/17/2021   TSH 2.10 03/17/2021   Lab Results  Component Value Date   TSH 2.10 03/17/2021   Lab Results  Component Value Date   WBC 3.9 (L) 03/17/2021   HGB 13.5 03/17/2021   HCT 41.4 03/17/2021   MCV 85.6 03/17/2021   PLT 252.0 03/17/2021   Lab Results  Component Value Date   NA 140 03/17/2021   K 4.7 03/17/2021   CO2 27 03/17/2021   GLUCOSE 81 03/17/2021   BUN 15 03/17/2021   CREATININE 0.94 03/17/2021   BILITOT 1.0 03/17/2021   ALKPHOS 87 03/17/2021   AST 17 03/17/2021   ALT 13 03/17/2021    PROT 7.4 03/17/2021   ALBUMIN 4.3 03/17/2021   CALCIUM 9.5 03/17/2021   GFR 64.37 03/17/2021   Lab Results  Component Value Date   CHOL 177 03/17/2021   Lab Results  Component Value Date   HDL 78.30 03/17/2021   Lab Results  Component Value Date   LDLCALC 86 03/17/2021   Lab Results  Component Value Date   TRIG 61.0 03/17/2021   Lab Results  Component Value Date   CHOLHDL 2 03/17/2021   No results found for: "HGBA1C"  Colonoscopy: Last completed on 12/21/2016. Normal results. Repeat in 5 years.   DEXA: She is interested in receiving a DEXA scan.   Pap Smear: Last completed on 07/20/2020. Normal results. Repeat in 3-5 years.   Mammogram: Last completed on 05/10/2020. No mammographic evidence of malignancy. Repeat in 1 year.     Assessment & Plan:   Problem List Items Addressed This Visit     Essential hypertension    Well controlled, no changes to meds. Encouraged heart healthy diet such as the DASH diet and exercise as tolerated.       Relevant Medications   amLODipine (NORVASC) 10 MG tablet   carvedilol (COREG) 6.25 MG tablet   Other Relevant Orders   CBC with Differential/Platelet   Comprehensive metabolic panel   TSH   Preventative health care - Primary    Patient encouraged to maintain heart  healthy diet, regular exercise, adequate sleep. Consider daily probiotics. Take medications as prescribed. Labs ordered and reviewed Colonoscopy 2018 repeat in 2023 [facility] 04/2020 repeat this year,ordered Pap 07/2020 repeat in 2025-26 Dexa, needs, ordered today Patient declines immunizations RSV (respiratory syncitial virus) vaccine at pharmacy, Arexvy Covid booster  version out any time at pharmacy High dose flu shot in October Tetanus Prevnar 20 Shingrix is the new shingles shot, 2 shots over 2-6 months, confirm coverage with insurance and document, then can return here for shots with nurse appt or at pharmacy      Relevant Orders   CBC with  Differential/Platelet   Comprehensive metabolic panel   TSH   Hyperlipidemia    Encourage heart healthy diet such as MIND or DASH diet, increase exercise, avoid trans fats, simple carbohydrates and processed foods, consider a krill or fish or flaxseed oil cap daily.       Relevant Medications   amLODipine (NORVASC) 10 MG tablet   carvedilol (COREG) 6.25 MG tablet   Other Relevant Orders   TSH   Lipid panel   Estrogen deficiency    Post menopausal Dexa scan ordered.       Relevant Orders   DG Bone Density   Other Visit Diagnoses     Colon cancer screening       Relevant Orders   Ambulatory referral to Gastroenterology   Encounter for screening for malignant neoplasm of breast, unspecified screening modality       Relevant Orders   MM 3D SCREEN BREAST BILATERAL   Post-menopausal       Relevant Orders   DG Bone Density      Meds ordered this encounter  Medications   amLODipine (NORVASC) 10 MG tablet    Sig: TAKE 1 TABLET(10 MG) BY MOUTH DAILY    Dispense:  90 tablet    Refill:  1   carvedilol (COREG) 6.25 MG tablet    Sig: TAKE 1 TABLET(6.25 MG) BY MOUTH TWICE DAILY WITH A MEAL    Dispense:  180 tablet    Refill:  1   I, Danise Edge, MD, personally preformed the services described in this documentation.  All medical record entries made by the scribe were at my direction and in my presence.  I have reviewed the chart and discharge instructions (if applicable) and agree that the record reflects my personal performance and is accurate and complete. 03/21/2022  I,Mohammed Iqbal,acting as a scribe for Danise Edge, MD.,have documented all relevant documentation on the behalf of Danise Edge, MD,as directed by  Danise Edge, MD while in the presence of Danise Edge, MD.  Danise Edge, MD

## 2022-03-21 NOTE — Patient Instructions (Signed)
60-80 ounces 6-8 hours of sleep 4000- 8000 steps PURE or MIND diet  Preventive Care 17-65 Years Old, Female Preventive care refers to lifestyle choices and visits with your health care provider that can promote health and wellness. Preventive care visits are also called wellness exams. What can I expect for my preventive care visit? Counseling Your health care provider may ask you questions about your: Medical history, including: Past medical problems. Family medical history. Pregnancy history. Current health, including: Menstrual cycle. Method of birth control. Emotional well-being. Home life and relationship well-being. Sexual activity and sexual health. Lifestyle, including: Alcohol, nicotine or tobacco, and drug use. Access to firearms. Diet, exercise, and sleep habits. Work and work Statistician. Sunscreen use. Safety issues such as seatbelt and bike helmet use. Physical exam Your health care provider will check your: Height and weight. These may be used to calculate your BMI (body mass index). BMI is a measurement that tells if you are at a healthy weight. Waist circumference. This measures the distance around your waistline. This measurement also tells if you are at a healthy weight and may help predict your risk of certain diseases, such as type 2 diabetes and high blood pressure. Heart rate and blood pressure. Body temperature. Skin for abnormal spots. What immunizations do I need?  Vaccines are usually given at various ages, according to a schedule. Your health care provider will recommend vaccines for you based on your age, medical history, and lifestyle or other factors, such as travel or where you work. What tests do I need? Screening Your health care provider may recommend screening tests for certain conditions. This may include: Lipid and cholesterol levels. Diabetes screening. This is done by checking your blood sugar (glucose) after you have not eaten for a  while (fasting). Pelvic exam and Pap test. Hepatitis B test. Hepatitis C test. HIV (human immunodeficiency virus) test. STI (sexually transmitted infection) testing, if you are at risk. Lung cancer screening. Colorectal cancer screening. Mammogram. Talk with your health care provider about when you should start having regular mammograms. This may depend on whether you have a family history of breast cancer. BRCA-related cancer screening. This may be done if you have a family history of breast, ovarian, tubal, or peritoneal cancers. Bone density scan. This is done to screen for osteoporosis. Talk with your health care provider about your test results, treatment options, and if necessary, the need for more tests. Follow these instructions at home: Eating and drinking  Eat a diet that includes fresh fruits and vegetables, whole grains, lean protein, and low-fat dairy products. Take vitamin and mineral supplements as recommended by your health care provider. Do not drink alcohol if: Your health care provider tells you not to drink. You are pregnant, may be pregnant, or are planning to become pregnant. If you drink alcohol: Limit how much you have to 0-1 drink a day. Know how much alcohol is in your drink. In the [place] one drink equals one 12 oz bottle of beer (355 mL), one 5 oz glass of wine (148 mL), or one 1 oz glass of hard liquor (44 mL). Lifestyle Brush your teeth every morning and night with fluoride toothpaste. Floss one time each day. Exercise for at least 30 minutes 5 or more days each week. Do not use any products that contain nicotine or tobacco. These products include cigarettes, chewing tobacco, and vaping devices, such as e-cigarettes. If you need help quitting, ask your health care provider. Do not use drugs. If you are  sexually active, practice safe sex. Use a condom or other form of protection to prevent STIs. If you do not wish to become pregnant, use a form of birth  control. If you plan to become pregnant, see your health care provider for a prepregnancy visit. Take aspirin only as told by your health care provider. Make sure that you understand how much to take and what form to take. Work with your health care provider to find out whether it is safe and beneficial for you to take aspirin daily. Find healthy ways to manage stress, such as: Meditation, yoga, or listening to music. Journaling. Talking to a trusted person. Spending time with friends and family. Minimize exposure to UV radiation to reduce your risk of skin cancer. Safety Always wear your seat belt while driving or riding in a vehicle. Do not drive: If you have been drinking alcohol. Do not ride with someone who has been drinking. When you are tired or distracted. While texting. If you have been using any mind-altering substances or drugs. Wear a helmet and other protective equipment during sports activities. If you have firearms in your house, make sure you follow all gun safety procedures. Seek help if you have been physically or sexually abused. What's next? Visit your health care provider once a year for an annual wellness visit. Ask your health care provider how often you should have your eyes and teeth checked. Stay up to date on all vaccines. This information is not intended to replace advice given to you by your health care provider. Make sure you discuss any questions you have with your health care provider. Document Revised: 12/01/2020 Document Reviewed: 12/01/2020 Elsevier Patient Education  [employer].

## 2022-03-21 NOTE — Assessment & Plan Note (Signed)
Post menopausal Dexa scan ordered.

## 2022-03-22 LAB — CBC WITH DIFFERENTIAL/PLATELET
Basophils Absolute: 0 10*3/uL (ref 0.0–0.1)
Basophils Relative: 1 % (ref 0.0–3.0)
Eosinophils Absolute: 0.2 10*3/uL (ref 0.0–0.7)
Eosinophils Relative: 4.1 % (ref 0.0–5.0)
HCT: 40.3 % (ref 36.0–46.0)
Hemoglobin: 13.5 g/dL (ref 12.0–15.0)
Lymphocytes Relative: 37.9 % (ref 12.0–46.0)
Lymphs Abs: 1.4 10*3/uL (ref 0.7–4.0)
MCHC: 33.6 g/dL (ref 30.0–36.0)
MCV: 86.1 fl (ref 78.0–100.0)
Monocytes Absolute: 0.4 10*3/uL (ref 0.1–1.0)
Monocytes Relative: 11.7 % (ref 3.0–12.0)
Neutro Abs: 1.7 10*3/uL (ref 1.4–7.7)
Neutrophils Relative %: 45.3 % (ref 43.0–77.0)
Platelets: 231 10*3/uL (ref 150.0–400.0)
RBC: 4.68 Mil/uL (ref 3.87–5.11)
RDW: 14.8 % (ref 11.5–15.5)
WBC: 3.8 10*3/uL — ABNORMAL LOW (ref 4.0–10.5)

## 2022-03-22 LAB — LIPID PANEL
Cholesterol: 181 mg/dL (ref 0–200)
HDL: 77.6 mg/dL (ref >39.00)
LDL Cholesterol: 93 mg/dL (ref 0–99)
NonHDL: 102.98
Total CHOL/HDL Ratio: 2
Triglycerides: 50 mg/dL (ref 0.0–149.0)
VLDL: 10 mg/dL (ref 0.0–40.0)

## 2022-03-22 LAB — COMPREHENSIVE METABOLIC PANEL
ALT: 10 U/L (ref 0–35)
AST: 16 U/L (ref 0–37)
Albumin: 4.2 g/dL (ref 3.5–5.2)
Alkaline Phosphatase: 83 U/L (ref 39–117)
BUN: 13 mg/dL (ref 6–23)
CO2: 26 mEq/L (ref 19–32)
Calcium: 9.3 mg/dL (ref 8.4–10.5)
Chloride: 108 mEq/L (ref 96–112)
Creatinine, Ser: 0.96 mg/dL (ref 0.40–1.20)
GFR: 62.32 mL/min (ref >60.00)
Glucose, Bld: 77 mg/dL (ref 70–99)
Potassium: 4.5 mEq/L (ref 3.5–5.1)
Sodium: 141 mEq/L (ref 135–145)
Total Bilirubin: 1 mg/dL (ref 0.2–1.2)
Total Protein: 7.2 g/dL (ref 6.0–8.3)

## 2022-03-22 LAB — TSH: TSH: 1.94 u[IU]/mL (ref 0.35–5.50)

## 2022-03-29 ENCOUNTER — Encounter (HOSPITAL_BASED_OUTPATIENT_CLINIC_OR_DEPARTMENT_OTHER): Payer: Self-pay

## 2022-03-29 ENCOUNTER — Ambulatory Visit (HOSPITAL_BASED_OUTPATIENT_CLINIC_OR_DEPARTMENT_OTHER)
Admission: RE | Admit: 2022-03-29 | Discharge: 2022-03-29 | Disposition: A | Discharge status: Home / Self Care | Payer: BC Managed Care – PPO | Source: Ambulatory Visit | Attending: Family Medicine | Admitting: Family Medicine

## 2022-03-29 DIAGNOSIS — Z1231 Encounter for screening mammogram for malignant neoplasm of breast: Secondary | ICD-10-CM | POA: Insufficient documentation

## 2022-03-29 DIAGNOSIS — Z1239 Encounter for other screening for malignant neoplasm of breast: Secondary | ICD-10-CM

## 2022-06-02 ENCOUNTER — Other Ambulatory Visit: Payer: Self-pay | Admitting: Family Medicine

## 2022-09-21 ENCOUNTER — Ambulatory Visit: Payer: BC Managed Care – PPO | Admitting: Family Medicine

## 2022-12-10 ENCOUNTER — Other Ambulatory Visit: Payer: Self-pay | Admitting: Family Medicine

## 2022-12-25 NOTE — Assessment & Plan Note (Signed)
Well controlled, no changes to meds. Encouraged heart healthy diet such as the DASH diet and exercise as tolerated.  °

## 2022-12-25 NOTE — Progress Notes (Unsigned)
Subjective:    Patient ID: Amber [name], female    DOB: Apr 23, 1956, 67 y.o.   MRN: [id number]  No chief complaint on file.   HPI Discussed the use of AI scribe software for clinical note transcription with the patient, who gave verbal consent to proceed.  History of Present Illness        Patient is a 67 yo female in today for follow up on chronic medical concerns. No recent febrile illness or hospitalizations. Denies CP/palp/SOB/HA/congestion/fevers/GI or GU c/o. Taking meds as prescribed     Past Medical History:  Diagnosis Date   Allergic state 09/19/2013   Allergy    Angioedema    Chicken pox    Epistaxis 01/15/2013   Hyperlipidemia 10/08/2016   Hypertension    Leukopenia 12/06/2014   Measles    Mumps    Overweight 08/15/2007   Qualifier: Diagnosis of  By: Nena Jordan     No past surgical history on file.  Family History  Problem Relation Age of Onset   Diabetes Mother 21   Kidney disease Mother    Heart disease Mother    Cancer Father        pancreatic   Pancreatic cancer Father    Arthritis Sister    Hypertension Sister    Hypertension Sister    Hypertension Sister    Hypertension Sister    Hypertension Brother    Diabetes Brother    Diabetes Paternal Grandmother    Stroke Paternal Grandfather    Hypertension Other        multiple   Colon cancer Neg Hx    Esophageal cancer Neg Hx    Rectal cancer Neg Hx    Stomach cancer Neg Hx     Social History   Socioeconomic History   Marital status: Divorced    Spouse name: Not on file   Number of children: Not on file   Years of education: Not on file   Highest education level: Not on file  Occupational History   Not on file  Tobacco Use   Smoking status: Never   Smokeless tobacco: Never  Substance and Sexual Activity   Alcohol use: No   Drug use: No   Sexual activity: Not Currently    Comment: no dietary restrictions, lives alone, insurance work  Other Topics Concern   Not on file   Social History Narrative   Not on file   Social Determinants of Health   Financial Resource Strain: Not on file  Food Insecurity: Not on file  Transportation Needs: Not on file  Physical Activity: Not on file  Stress: Not on file  Social Connections: Not on file  Intimate Partner Violence: Not on file    Outpatient Medications Prior to Visit  Medication Sig Dispense Refill   amLODipine (NORVASC) 10 MG tablet Take 1 tablet (10 mg total) by mouth daily. 90 tablet 1   carvedilol (COREG) 6.25 MG tablet TAKE 1 TABLET(6.25 MG) BY MOUTH TWICE DAILY WITH A MEAL 180 tablet 1   Multiple Vitamins-Minerals (MEGA MULTIVITAMIN FOR WOMEN PO) Take 1 tablet by mouth daily. Reported on 10/20/2015     No facility-administered medications prior to visit.    Allergies  Allergen Reactions   Mixed Ragweed     Review of Systems  Constitutional:  Negative for fever and malaise/fatigue.  HENT:  Negative for congestion.   Eyes:  Negative for blurred vision.  Respiratory:  Negative for shortness of breath.  Cardiovascular:  Negative for chest pain, palpitations and leg swelling.  Gastrointestinal:  Negative for abdominal pain, blood in stool and nausea.  Genitourinary:  Negative for dysuria and frequency.  Musculoskeletal:  Negative for falls.  Skin:  Negative for rash.  Neurological:  Negative for dizziness, loss of consciousness and headaches.  Endo/Heme/Allergies:  Negative for environmental allergies.  Psychiatric/Behavioral:  Negative for depression. The patient is not nervous/anxious.        Objective:    Physical Exam Constitutional:      General: She is not in acute distress.    Appearance: Normal appearance. She is well-developed. She is not toxic-appearing.  HENT:     Head: Normocephalic and atraumatic.     Right Ear: External ear normal.     Left Ear: External ear normal.     Nose: Nose normal.  Eyes:     General:        Right eye: No discharge.        Left eye: No  discharge.     Conjunctiva/sclera: Conjunctivae normal.  Neck:     Thyroid: No thyromegaly.  Cardiovascular:     Rate and Rhythm: Normal rate and regular rhythm.     Heart sounds: Normal heart sounds. No murmur heard. Pulmonary:     Effort: Pulmonary effort is normal. No respiratory distress.     Breath sounds: Normal breath sounds.  Abdominal:     General: Bowel sounds are normal.     Palpations: Abdomen is soft.     Tenderness: There is no abdominal tenderness. There is no guarding.  Musculoskeletal:        General: Normal range of motion.     Cervical back: Neck supple.  Lymphadenopathy:     Cervical: No cervical adenopathy.  Skin:    General: Skin is warm and dry.  Neurological:     Mental Status: She is alert and oriented to person, place, and time.  Psychiatric:        Mood and Affect: Mood normal.        Behavior: Behavior normal.        Thought Content: Thought content normal.        Judgment: Judgment normal.     There were no vitals taken for this visit. Wt Readings from Last 3 Encounters:  03/21/22 192 lb 12.8 oz (87.5 kg)  10/27/21 198 lb 6.4 oz (90 kg)  03/17/21 199 lb (90.3 kg)    Diabetic Foot Exam - Simple   No data filed    Lab Results  Component Value Date   WBC 3.8 (L) 03/21/2022   HGB 13.5 03/21/2022   HCT 40.3 03/21/2022   PLT 231.0 03/21/2022   GLUCOSE 77 03/21/2022   CHOL 181 03/21/2022   TRIG 50.0 03/21/2022   HDL 77.60 03/21/2022   LDLCALC 93 03/21/2022   ALT 10 03/21/2022   AST 16 03/21/2022   NA 141 03/21/2022   K 4.5 03/21/2022   CL 108 03/21/2022   CREATININE 0.96 03/21/2022   BUN 13 03/21/2022   CO2 26 03/21/2022   TSH 1.94 03/21/2022    Lab Results  Component Value Date   TSH 1.94 03/21/2022   Lab Results  Component Value Date   WBC 3.8 (L) 03/21/2022   HGB 13.5 03/21/2022   HCT 40.3 03/21/2022   MCV 86.1 03/21/2022   PLT 231.0 03/21/2022   Lab Results  Component Value Date   NA 141 03/21/2022   K 4.5  03/21/2022  CO2 26 03/21/2022   GLUCOSE 77 03/21/2022   BUN 13 03/21/2022   CREATININE 0.96 03/21/2022   BILITOT 1.0 03/21/2022   ALKPHOS 83 03/21/2022   AST 16 03/21/2022   ALT 10 03/21/2022   PROT 7.2 03/21/2022   ALBUMIN 4.2 03/21/2022   CALCIUM 9.3 03/21/2022   GFR 62.32 03/21/2022   Lab Results  Component Value Date   CHOL 181 03/21/2022   Lab Results  Component Value Date   HDL 77.60 03/21/2022   Lab Results  Component Value Date   LDLCALC 93 03/21/2022   Lab Results  Component Value Date   TRIG 50.0 03/21/2022   Lab Results  Component Value Date   CHOLHDL 2 03/21/2022   No results found for: "HGBA1C"     Assessment & Plan:  Mixed hyperlipidemia Assessment & Plan: Encourage heart healthy diet such as MIND or DASH diet, increase exercise, avoid trans fats, simple carbohydrates and processed foods, consider a krill or fish or flaxseed oil cap daily.    Essential hypertension Assessment & Plan: Well controlled, no changes to meds. Encouraged heart healthy diet such as the DASH diet and exercise as tolerated.     Leukopenia, unspecified type Assessment & Plan: Continue to monitor     Assessment and Plan              Danise Edge, MD

## 2022-12-25 NOTE — Assessment & Plan Note (Signed)
Encourage heart healthy diet such as MIND or DASH diet, increase exercise, avoid trans fats, simple carbohydrates and processed foods, consider a krill or fish or flaxseed oil cap daily.  °

## 2022-12-25 NOTE — Assessment & Plan Note (Signed)
Continue to monitor

## 2022-12-26 ENCOUNTER — Ambulatory Visit: Payer: BC Managed Care – PPO | Admitting: Family Medicine

## 2022-12-26 VITALS — BP 122/74 | HR 76 | Temp 98.0°F | Resp 16 | Ht 68.0 in | Wt 181.6 lb

## 2022-12-26 DIAGNOSIS — E782 Mixed hyperlipidemia: Secondary | ICD-10-CM

## 2022-12-26 DIAGNOSIS — I10 Essential (primary) hypertension: Secondary | ICD-10-CM

## 2022-12-26 DIAGNOSIS — D72819 Decreased white blood cell count, unspecified: Secondary | ICD-10-CM

## 2022-12-26 DIAGNOSIS — Z1211 Encounter for screening for malignant neoplasm of colon: Secondary | ICD-10-CM

## 2022-12-26 NOTE — Patient Instructions (Addendum)
Magnesium Glycinate [phone] mg at bedtime  L Tryptophan caps for sleep   Call   Insomnia Insomnia is a sleep disorder that makes it difficult to fall asleep or stay asleep. Insomnia can cause fatigue, low energy, difficulty concentrating, mood swings, and poor performance at work or school. There are three different ways to classify insomnia: Difficulty falling asleep. Difficulty staying asleep. Waking up too early in the morning. Any type of insomnia can be long-term (chronic) or short-term (acute). Both are common. Short-term insomnia usually lasts for 3 months or less. Chronic insomnia occurs at least three times a week for longer than 3 months. What are the causes? Insomnia may be caused by another condition, situation, or substance, such as: Having certain mental health conditions, such as anxiety and depression. Using caffeine, alcohol, tobacco, or drugs. Having gastrointestinal conditions, such as gastroesophageal reflux disease (GERD). Having certain medical conditions. These include: Asthma. Alzheimer's disease. Stroke. Chronic pain. An overactive thyroid gland (hyperthyroidism). Other sleep disorders, such as restless legs syndrome and sleep apnea. Menopause. Sometimes, the cause of insomnia may not be known. What increases the risk? Risk factors for insomnia include: Gender. Females are affected more often than males. Age. Insomnia is more common as people get older. Stress and certain medical and mental health conditions. Lack of exercise. Having an irregular work schedule. This may include working night shifts and traveling between different time zones. What are the signs or symptoms? If you have insomnia, the main symptom is having trouble falling asleep or having trouble staying asleep. This may lead to other symptoms, such as: Feeling tired or having low energy. Feeling nervous about going to sleep. Not feeling rested in the morning. Having trouble  concentrating. Feeling irritable, anxious, or depressed. How is this diagnosed? This condition may be diagnosed based on: Your symptoms and medical history. Your health care provider may ask about: Your sleep habits. Any medical conditions you have. Your mental health. A physical exam. How is this treated? Treatment for insomnia depends on the cause. Treatment may focus on treating an underlying condition that is causing the insomnia. Treatment may also include: Medicines to help you sleep. Counseling or therapy. Lifestyle adjustments to help you sleep better. Follow these instructions at home: Eating and drinking  Limit or avoid alcohol, caffeinated beverages, and products that contain nicotine and tobacco, especially close to bedtime. These can disrupt your sleep. Do not eat a large meal or eat spicy foods right before bedtime. This can lead to digestive discomfort that can make it hard for you to sleep. Sleep habits  Keep a sleep diary to help you and your health care provider figure out what could be causing your insomnia. Write down: When you sleep. When you wake up during the night. How well you sleep and how rested you feel the next day. Any side effects of medicines you are taking. What you eat and drink. Make your bedroom a dark, comfortable place where it is easy to fall asleep. Put up shades or blackout curtains to block light from outside. Use a white noise machine to block noise. Keep the temperature cool. Limit screen use before bedtime. This includes: Not watching TV. Not using your smartphone, tablet, or computer. Stick to a routine that includes going to bed and waking up at the same times every day and night. This can help you fall asleep faster. Consider making a quiet activity, such as reading, part of your nighttime routine. Try to avoid taking naps during the day  so that you sleep better at night. Get out of bed if you are still awake after 15 minutes of  trying to sleep. Keep the lights down, but try reading or doing a quiet activity. When you feel sleepy, go back to bed. General instructions Take over-the-counter and prescription medicines only as told by your health care provider. Exercise regularly as told by your health care provider. However, avoid exercising in the hours right before bedtime. Use relaxation techniques to manage stress. Ask your health care provider to suggest some techniques that may work well for you. These may include: Breathing exercises. Routines to release muscle tension. Visualizing peaceful scenes. Make sure that you drive carefully. Do not drive if you feel very sleepy. Keep all follow-up visits. This is important. Contact a health care provider if: You are tired throughout the day. You have trouble in your daily routine due to sleepiness.        vc You continue to have sleep problems, or your sleep problems get worse. Get help right away if: You have thoughts about hurting yourself or someone else. Get help right away if you feel like you may hurt yourself or others, or have thoughts about taking your own life. Go to your nearest emergency room or: Call 911. Call the National Suicide Prevention Lifeline at 779-002-2146 or 988. This is open 24 hours a day. Text the Crisis Text Line at 216 270 7678. Summary Insomnia is a sleep disorder that makes it difficult to fall asleep or stay asleep. Insomnia can be long-term (chronic) or short-term (acute). Treatment for insomnia depends on the cause. Treatment may focus on treating an underlying condition that is causing the insomnia. Keep a sleep diary to help you and your health care provider figure out what could be causing your insomnia. This information is not intended to replace advice given to you by your health care provider. Make sure you discuss any questions you have with your health care provider. Document Revised: 05/16/2021 Document Reviewed:  05/16/2021 Elsevier Patient Education  2024 [employer].

## 2022-12-27 ENCOUNTER — Encounter: Payer: Self-pay | Admitting: Family Medicine

## 2022-12-27 LAB — COMPREHENSIVE METABOLIC PANEL
ALT: 11 U/L (ref 0–35)
AST: 15 U/L (ref 0–37)
Albumin: 4.2 g/dL (ref 3.5–5.2)
Alkaline Phosphatase: 80 U/L (ref 39–117)
BUN: 14 mg/dL (ref 6–23)
CO2: 27 mEq/L (ref 19–32)
Calcium: 9.5 mg/dL (ref 8.4–10.5)
Chloride: 108 mEq/L (ref 96–112)
Creatinine, Ser: 0.91 mg/dL (ref 0.40–1.20)
GFR: 66.09 mL/min (ref >60.00)
Glucose, Bld: 89 mg/dL (ref 70–99)
Potassium: 4.4 mEq/L (ref 3.5–5.1)
Sodium: 141 mEq/L (ref 135–145)
Total Bilirubin: 0.9 mg/dL (ref 0.2–1.2)
Total Protein: 7.1 g/dL (ref 6.0–8.3)

## 2022-12-27 LAB — CBC WITH DIFFERENTIAL/PLATELET
Basophils Absolute: 0 10*3/uL (ref 0.0–0.1)
Basophils Relative: 1 % (ref 0.0–3.0)
Eosinophils Absolute: 0.2 10*3/uL (ref 0.0–0.7)
Eosinophils Relative: 3.9 % (ref 0.0–5.0)
HCT: 39.5 % (ref 36.0–46.0)
Hemoglobin: 13 g/dL (ref 12.0–15.0)
Lymphocytes Relative: 43.1 % (ref 12.0–46.0)
Lymphs Abs: 1.8 10*3/uL (ref 0.7–4.0)
MCHC: 32.8 g/dL (ref 30.0–36.0)
MCV: 86.3 fl (ref 78.0–100.0)
Monocytes Absolute: 0.5 10*3/uL (ref 0.1–1.0)
Monocytes Relative: 10.8 % (ref 3.0–12.0)
Neutro Abs: 1.7 10*3/uL (ref 1.4–7.7)
Neutrophils Relative %: 41.2 % — ABNORMAL LOW (ref 43.0–77.0)
Platelets: 247 10*3/uL (ref 150.0–400.0)
RBC: 4.58 Mil/uL (ref 3.87–5.11)
RDW: 14.5 % (ref 11.5–15.5)
WBC: 4.2 10*3/uL (ref 4.0–10.5)

## 2022-12-27 LAB — LIPID PANEL
Cholesterol: 185 mg/dL (ref 0–200)
HDL: 73.9 mg/dL (ref >39.00)
LDL Cholesterol: 99 mg/dL (ref 0–99)
NonHDL: 110.74
Total CHOL/HDL Ratio: 2
Triglycerides: 61 mg/dL (ref 0.0–149.0)
VLDL: 12.2 mg/dL (ref 0.0–40.0)

## 2022-12-27 LAB — TSH: TSH: 1.53 u[IU]/mL (ref 0.35–5.50)

## 2022-12-28 ENCOUNTER — Ambulatory Visit: Payer: BC Managed Care – PPO | Admitting: Family Medicine

## 2023-02-19 ENCOUNTER — Other Ambulatory Visit: Payer: Self-pay | Admitting: Family Medicine

## 2023-05-11 ENCOUNTER — Telehealth: Payer: Self-pay | Admitting: Family Medicine

## 2023-05-11 ENCOUNTER — Other Ambulatory Visit: Payer: Self-pay | Admitting: Emergency Medicine

## 2023-05-11 DIAGNOSIS — Z1231 Encounter for screening mammogram for malignant neoplasm of breast: Secondary | ICD-10-CM

## 2023-05-11 NOTE — Telephone Encounter (Signed)
Called patient and let her know the referral has been place for mammogram

## 2023-05-11 NOTE — Telephone Encounter (Signed)
Patient called and would like a mammo order placed so she can schedule an appt for her mammogram.

## 2023-05-21 ENCOUNTER — Ambulatory Visit (HOSPITAL_BASED_OUTPATIENT_CLINIC_OR_DEPARTMENT_OTHER)
Admission: RE | Admit: 2023-05-21 | Discharge: 2023-05-21 | Disposition: A | Discharge status: Home / Self Care | Payer: BC Managed Care – PPO | Source: Ambulatory Visit | Attending: Family Medicine | Admitting: Family Medicine

## 2023-05-21 ENCOUNTER — Encounter (HOSPITAL_BASED_OUTPATIENT_CLINIC_OR_DEPARTMENT_OTHER): Payer: Self-pay

## 2023-05-21 DIAGNOSIS — Z1231 Encounter for screening mammogram for malignant neoplasm of breast: Secondary | ICD-10-CM | POA: Diagnosis not present

## 2023-05-26 ENCOUNTER — Other Ambulatory Visit: Payer: Self-pay | Admitting: Family Medicine

## 2023-07-03 ENCOUNTER — Encounter: Payer: BC Managed Care – PPO | Admitting: Family Medicine

## 2023-07-24 ENCOUNTER — Encounter: Payer: BC Managed Care – PPO | Admitting: Family Medicine

## 2023-08-10 ENCOUNTER — Other Ambulatory Visit: Payer: Self-pay | Admitting: Family Medicine

## 2023-11-08 ENCOUNTER — Other Ambulatory Visit: Payer: Self-pay | Admitting: Family Medicine

## 2024-03-23 NOTE — Assessment & Plan Note (Signed)
 Well controlled, no changes to meds. Encouraged heart healthy diet such as the DASH diet and exercise as tolerated.

## 2024-03-23 NOTE — Assessment & Plan Note (Addendum)
 Patient encouraged to maintain heart healthy diet, regular exercise, adequate sleep. Consider daily probiotics. Take medications as prescribed. Labs ordered and reviewed Colonoscopy 2018 repeat in 2023 [facility] 05/2023 repeat this year Pap 07/2020 repeat in 2025-26 Dexa, needs, ordered today Patient declines immunizations RSV (respiratory syncitial virus) vaccine at pharmacy, Arexvy Covid booster  version out any time at pharmacy High dose flu shot in October Tetanus Prevnar 20 Shingrix is the new shingles shot, 2 shots over 2-6 months, confirm coverage with insurance and document, then can return here for shots with nurse appt or at pharmacy

## 2024-03-23 NOTE — Assessment & Plan Note (Signed)
 Encourage heart healthy diet such as MIND or DASH diet, increase exercise, avoid trans fats, simple carbohydrates and processed foods, consider a krill or fish or flaxseed oil cap daily.

## 2024-03-24 ENCOUNTER — Encounter: Payer: Self-pay | Admitting: Family Medicine

## 2024-03-24 ENCOUNTER — Ambulatory Visit (INDEPENDENT_AMBULATORY_CARE_PROVIDER_SITE_OTHER): Payer: BC Managed Care – PPO | Admitting: Family Medicine

## 2024-03-24 VITALS — BP 117/73 | HR 64 | Ht 68.0 in | Wt 194.4 lb

## 2024-03-24 DIAGNOSIS — E782 Mixed hyperlipidemia: Secondary | ICD-10-CM

## 2024-03-24 DIAGNOSIS — Z1231 Encounter for screening mammogram for malignant neoplasm of breast: Secondary | ICD-10-CM

## 2024-03-24 DIAGNOSIS — Z1211 Encounter for screening for malignant neoplasm of colon: Secondary | ICD-10-CM | POA: Diagnosis not present

## 2024-03-24 DIAGNOSIS — Z0000 Encounter for general adult medical examination without abnormal findings: Secondary | ICD-10-CM

## 2024-03-24 DIAGNOSIS — I10 Essential (primary) hypertension: Secondary | ICD-10-CM | POA: Diagnosis not present

## 2024-03-24 DIAGNOSIS — Z780 Asymptomatic menopausal state: Secondary | ICD-10-CM | POA: Diagnosis not present

## 2024-03-24 DIAGNOSIS — E2839 Other primary ovarian failure: Secondary | ICD-10-CM

## 2024-03-24 NOTE — Patient Instructions (Addendum)
 Mediterranean or MIND diet  Shingrix is the new shingles shot, 2 shots over 2-6 months, confirm coverage with insurance and document, then can return here for shots with nurse appt or at pharmacy   Flu and covid boosters annually RSV, Respiratory Syncitial Virus Vaccine, Arexvy at pharmacy Prevnar 20 at pharmacy  [facility] pharmacies are all walk in vaccine clinics 9-4 M-F  CBTi insomnia APP by the [facility] administration [facility]  Preventive Care 65 Years and Older, Female Preventive care refers to lifestyle choices and visits with your health care provider that can promote health and wellness. Preventive care visits are also called wellness exams. What can I expect for my preventive care visit? Counseling Your health care provider may ask you questions about your: Medical history, including: Past medical problems. Family medical history. Pregnancy and menstrual history. History of falls. Current health, including: Memory and ability to understand (cognition). Emotional well-being. Home life and relationship well-being. Sexual activity and sexual health. Lifestyle, including: Alcohol, nicotine or tobacco, and drug use. Access to firearms. Diet, exercise, and sleep habits. Work and work Astronomer. Sunscreen use. Safety issues such as seatbelt and bike helmet use. Physical exam Your health care provider will check your: Height and weight. These may be used to calculate your BMI (body mass index). BMI is a measurement that tells if you are at a healthy weight. Waist circumference. This measures the distance around your waistline. This measurement also tells if you are at a healthy weight and may help predict your risk of certain diseases, such as type 2 diabetes and high blood pressure. Heart rate and blood pressure. Body temperature. Skin for abnormal spots. What immunizations do I need?  Vaccines are usually given at various ages, according to a schedule. Your health care provider  will recommend vaccines for you based on your age, medical history, and lifestyle or other factors, such as travel or where you work. What tests do I need? Screening Your health care provider may recommend screening tests for certain conditions. This may include: Lipid and cholesterol levels. Hepatitis C test. Hepatitis B test. HIV (human immunodeficiency virus) test. STI (sexually transmitted infection) testing, if you are at risk. Lung cancer screening. Colorectal cancer screening. Diabetes screening. This is done by checking your blood sugar (glucose) after you have not eaten for a while (fasting). Mammogram. Talk with your health care provider about how often you should have regular mammograms. BRCA-related cancer screening. This may be done if you have a family history of breast, ovarian, tubal, or peritoneal cancers. Bone density scan. This is done to screen for osteoporosis. Talk with your health care provider about your test results, treatment options, and if necessary, the need for more tests. Follow these instructions at home: Eating and drinking  Eat a diet that includes fresh fruits and vegetables, whole grains, lean protein, and low-fat dairy products. Limit your intake of foods with high amounts of sugar, saturated fats, and salt. Take vitamin and mineral supplements as recommended by your health care provider. Do not drink alcohol if your health care provider tells you not to drink. If you drink alcohol: Limit how much you have to 0-1 drink a day. Know how much alcohol is in your drink. In the [place] one drink equals one 12 oz bottle of beer (355 mL), one 5 oz glass of wine (148 mL), or one 1 oz glass of hard liquor (44 mL). Lifestyle Brush your teeth every morning and night with fluoride toothpaste. Floss one time each day. Exercise  for at least 30 minutes 5 or more days each week. Do not use any products that contain nicotine or tobacco. These products include cigarettes,  chewing tobacco, and vaping devices, such as e-cigarettes. If you need help quitting, ask your health care provider. Do not use drugs. If you are sexually active, practice safe sex. Use a condom or other form of protection in order to prevent STIs. Take aspirin only as told by your health care provider. Make sure that you understand how much to take and what form to take. Work with your health care provider to find out whether it is safe and beneficial for you to take aspirin daily. Ask your health care provider if you need to take a cholesterol-lowering medicine (statin). Find healthy ways to manage stress, such as: Meditation, yoga, or listening to music. Journaling. Talking to a trusted person. Spending time with friends and family. Minimize exposure to UV radiation to reduce your risk of skin cancer. Safety Always wear your seat belt while driving or riding in a vehicle. Do not drive: If you have been drinking alcohol. Do not ride with someone who has been drinking. When you are tired or distracted. While texting. If you have been using any mind-altering substances or drugs. Wear a helmet and other protective equipment during sports activities. If you have firearms in your house, make sure you follow all gun safety procedures. What's next? Visit your health care provider once a year for an annual wellness visit. Ask your health care provider how often you should have your eyes and teeth checked. Stay up to date on all vaccines. This information is not intended to replace advice given to you by your health care provider. Make sure you discuss any questions you have with your health care provider. Document Revised: 12/01/2020 Document Reviewed: 12/01/2020 Elsevier Patient Education  2024 [employer].

## 2024-03-25 NOTE — Progress Notes (Signed)
 Subjective:    Patient ID: Amber [name], female    DOB: [birth year], 68 y.o.   MRN: [id number]  No chief complaint on file.   HPI Discussed the use of AI scribe software for clinical note transcription with the patient, who gave verbal consent to proceed.  History of Present Illness Amber [name] is a 68 year old female who presents for routine follow-up and preventive care.  She recalls a recent higher reading at the dentist's office, attributing it to stress. She has a history of elevated blood pressure readings, particularly during stressful situations, and is mindful of her family history of stroke, especially on her father's side.  She is overdue for a colonoscopy, with the last one performed in 2018. She recalls having polyps removed in the past but not during the most recent procedure. She has been referred for a colonoscopy multiple times in the past few years but has not completed the process.  Her last mammogram was in December 2024. She is considering scheduling her next one accordingly.  She mentions a family history of stroke, with her sister experiencing a stroke in June. Her sister had difficulty managing her blood pressure, which remained elevated despite treatment. She is aware of the importance of monitoring her own blood pressure due to this family history.  No significant changes in her family history, aside from her sister's stroke. She has four sisters and one brother, with no new diagnoses or deaths reported.  She tries to walk multiple times a week and is mindful of her diet, particularly in relation to her family history of stroke.  No significant changes in bowel or bladder habits, no trouble swallowing, and no persistent cough. She notes an increase in urination last week, which she attributes to increased fluid intake.  She has difficulty sleeping, often waking up during the night, and is exploring non-pharmacological methods to improve her sleep  quality.    Past Medical History:  Diagnosis Date   Allergic state 09/19/2013   Allergy    Angioedema    Chicken pox    Epistaxis 01/15/2013   Hyperlipidemia 10/08/2016   Hypertension    Leukopenia 12/06/2014   Measles    Mumps    Overweight 08/15/2007   Qualifier: Diagnosis of  By: Amber [name]     History reviewed. No pertinent surgical history.  Family History  Problem Relation Age of Onset   Diabetes Mother 33   Kidney disease Mother    Heart disease Mother    Cancer Father        pancreatic   Pancreatic cancer Father    Arthritis Sister    Hypertension Sister    Stroke Sister    Hypertension Sister    Hypertension Sister    Hypertension Sister    Hypertension Brother    Diabetes Brother    Diabetes Paternal Grandmother    Stroke Paternal Grandfather    Hypertension Other        multiple   Colon cancer Neg Hx    Esophageal cancer Neg Hx    Rectal cancer Neg Hx    Stomach cancer Neg Hx     Social History   Socioeconomic History   Marital status: Divorced    Spouse name: Not on file   Number of children: Not on file   Years of education: Not on file   Highest education level: Not on file  Occupational History   Not on file  Tobacco Use  Smoking status: Never   Smokeless tobacco: Never  Substance and Sexual Activity   Alcohol use: No   Drug use: No   Sexual activity: Not Currently    Comment: no dietary restrictions, lives alone, insurance work  Other Topics Concern   Not on file  Social History Narrative   Not on file   Social Drivers of Health   Financial Resource Strain: Not on file  Food Insecurity: Not on file  Transportation Needs: Not on file  Physical Activity: Not on file  Stress: Not on file  Social Connections: Not on file  Intimate Partner Violence: Not on file    Outpatient Medications Prior to Visit  Medication Sig Dispense Refill   amLODipine  (NORVASC ) 10 MG tablet Take 1 tablet (10 mg total) by mouth daily. 90 tablet  2   carvedilol  (COREG ) 6.25 MG tablet TAKE 1 TABLET(6.25 MG) BY MOUTH TWICE DAILY WITH A MEAL 180 tablet 1   Multiple Vitamins-Minerals (MEGA MULTIVITAMIN FOR WOMEN PO) Take 1 tablet by mouth daily. Reported on 10/20/2015     No facility-administered medications prior to visit.    Allergies  Allergen Reactions   Mixed Ragweed     Review of Systems  Constitutional:  Negative for fever and malaise/fatigue.  HENT:  Negative for congestion.   Eyes:  Negative for blurred vision.  Respiratory:  Negative for shortness of breath.   Cardiovascular:  Negative for chest pain, palpitations and leg swelling.  Gastrointestinal:  Negative for abdominal pain, blood in stool and nausea.  Genitourinary:  Negative for dysuria and frequency.  Musculoskeletal:  Negative for falls.  Skin:  Negative for rash.  Neurological:  Negative for dizziness, loss of consciousness and headaches.  Endo/Heme/Allergies:  Negative for environmental allergies.  Psychiatric/Behavioral:  Negative for depression. The patient is not nervous/anxious.        Objective:    Physical Exam Constitutional:      General: She is not in acute distress.    Appearance: Normal appearance. She is well-developed. She is not toxic-appearing.  HENT:     Head: Normocephalic and atraumatic.     Right Ear: External ear normal.     Left Ear: External ear normal.     Nose: Nose normal.  Eyes:     General:        Right eye: No discharge.        Left eye: No discharge.     Conjunctiva/sclera: Conjunctivae normal.  Neck:     Thyroid : No thyromegaly.  Cardiovascular:     Rate and Rhythm: Normal rate and regular rhythm.     Heart sounds: Normal heart sounds. No murmur heard. Pulmonary:     Effort: Pulmonary effort is normal. No respiratory distress.     Breath sounds: Normal breath sounds.  Abdominal:     General: Bowel sounds are normal.     Palpations: Abdomen is soft.     Tenderness: There is no abdominal tenderness. There is no  guarding.  Musculoskeletal:        General: Normal range of motion.     Cervical back: Neck supple.  Lymphadenopathy:     Cervical: No cervical adenopathy.  Skin:    General: Skin is warm and dry.  Neurological:     Mental Status: She is alert and oriented to person, place, and time.  Psychiatric:        Mood and Affect: Mood normal.        Behavior: Behavior normal.  Thought Content: Thought content normal.        Judgment: Judgment normal.     BP 117/73   Pulse 64   Ht 5' 8 (1.727 m)   Wt 194 lb 6.4 oz (88.2 kg)   SpO2 100%   BMI 29.56 kg/m  Wt Readings from Last 3 Encounters:  03/24/24 194 lb 6.4 oz (88.2 kg)  12/26/22 181 lb 9.6 oz (82.4 kg)  03/21/22 192 lb 12.8 oz (87.5 kg)    Diabetic Foot Exam - Simple   No data filed    Lab Results  Component Value Date   WBC 4.2 12/26/2022   HGB 13.0 12/26/2022   HCT 39.5 12/26/2022   PLT 247.0 12/26/2022   GLUCOSE 89 12/26/2022   CHOL 185 12/26/2022   TRIG 61.0 12/26/2022   HDL 73.90 12/26/2022   LDLCALC 99 12/26/2022   ALT 11 12/26/2022   AST 15 12/26/2022   NA 141 12/26/2022   K 4.4 12/26/2022   CL 108 12/26/2022   CREATININE 0.91 12/26/2022   BUN 14 12/26/2022   CO2 27 12/26/2022   TSH 1.53 12/26/2022    Lab Results  Component Value Date   TSH 1.53 12/26/2022   Lab Results  Component Value Date   WBC 4.2 12/26/2022   HGB 13.0 12/26/2022   HCT 39.5 12/26/2022   MCV 86.3 12/26/2022   PLT 247.0 12/26/2022   Lab Results  Component Value Date   NA 141 12/26/2022   K 4.4 12/26/2022   CO2 27 12/26/2022   GLUCOSE 89 12/26/2022   BUN 14 12/26/2022   CREATININE 0.91 12/26/2022   BILITOT 0.9 12/26/2022   ALKPHOS 80 12/26/2022   AST 15 12/26/2022   ALT 11 12/26/2022   PROT 7.1 12/26/2022   ALBUMIN 4.2 12/26/2022   CALCIUM 9.5 12/26/2022   GFR 66.09 12/26/2022   Lab Results  Component Value Date   CHOL 185 12/26/2022   Lab Results  Component Value Date   HDL 73.90 12/26/2022   Lab  Results  Component Value Date   LDLCALC 99 12/26/2022   Lab Results  Component Value Date   TRIG 61.0 12/26/2022   Lab Results  Component Value Date   CHOLHDL 2 12/26/2022   No results found for: HGBA1C     Assessment & Plan:  Essential hypertension Assessment & Plan: Well controlled, no changes to meds. Encouraged heart healthy diet such as the DASH diet and exercise as tolerated.    Orders: -     CBC with Differential/Platelet; Future -     Comprehensive metabolic panel with GFR; Future -     TSH; Future  Mixed hyperlipidemia Assessment & Plan: Encourage heart healthy diet such as MIND or DASH diet, increase exercise, avoid trans fats, simple carbohydrates and processed foods, consider a krill or fish or flaxseed oil cap daily.   Orders: -     Lipid panel; Future  Preventative health care Assessment & Plan: Patient encouraged to maintain heart healthy diet, regular exercise, adequate sleep. Consider daily probiotics. Take medications as prescribed. Labs ordered and reviewed Colonoscopy 2018 repeat in 2023 [facility] 05/2023 repeat this year Pap 07/2020 repeat in 2025-26 Dexa, needs, ordered today Patient declines immunizations RSV (respiratory syncitial virus) vaccine at pharmacy, Arexvy Covid booster  version out any time at pharmacy High dose flu shot in October Tetanus Prevnar 20 Shingrix is the new shingles shot, 2 shots over 2-6 months, confirm coverage with insurance and document, then can return here for  shots with nurse appt or at pharmacy   Colon cancer screening -     Ambulatory referral to Gastroenterology  Breast cancer screening by mammogram -     3D Screening Mammogram, Left and Right; Future  Post-menopausal -     DG Bone Density; Future  Estrogen deficiency -     DG Bone Density; Future    Assessment and Plan Assessment & Plan Adult Wellness Visit Routine adult wellness visit with emphasis on lifestyle modifications and family history of  stroke. - Encourage regular physical activity and adherence to a Mediterranean or MIND diet. - Emphasize importance of hydration and sleep. - Continue current medications and supplements.  Essential hypertension Blood pressure is well-controlled at 117/73 mmHg. Discussed family history of stroke and importance of maintaining blood pressure control through lifestyle modifications. - Continue Amlodipine  10 mg daily and Carvedilol  6.25 mg twice daily. - Encourage regular monitoring of blood pressure. - Emphasize lifestyle modifications including diet and exercise.  Insomnia Reports difficulty staying asleep. Discussed non-pharmacological approaches to improve sleep quality. - Recommend CBT-I Coach app for cognitive behavioral therapy for insomnia. - Encourage sleep hygiene practices and monitoring of dietary triggers.  Mixed hyperlipidemia  Colon cancer screening Due for colonoscopy as last one was in 2018. Previous referrals were not completed. - Place new referral for colonoscopy. - Advise to monitor phone and MyChart for appointment scheduling. - Option to contact [facility] directly to schedule if referral is in place.  Bone density screening Due for bone density screening. Previous attempt to schedule was unsuccessful. - Order bone density scan. - Coordinate scheduling with mammogram if possible.  Breast cancer screening Last mammogram was in December 2024. Next one due in December 2025 or later. - Plan for next mammogram in May 2026 to balance frequency and radiation exposure.  Cervical cancer screening Pap smear screening is up to date. Next screening due in five years unless symptoms arise. - Continue with current screening interval unless symptoms develop.  General Health Maintenance Emphasized importance of vaccinations to prevent pneumonia and other respiratory illnesses. Discussed risks and benefits of each vaccine, including potential severe reactions and convenience of  receiving them at [facility] pharmacies. - Recommend annual influenza and COVID vaccinations. - Recommend RSV and pneumococcal vaccinations. - Discuss shingles vaccination and its potential benefits, including reduced risk of dementia. - Encourage use of [facility] pharmacies for vaccinations due to convenience and safety.  Goals of Care Discussed importance of having an advanced directive and healthcare power of attorney. Emphasized communicating wishes to trusted individuals for decision-making in case of severe illness or injury. - Consider completing an advanced directive and healthcare power of attorney. - Discuss wishes with trusted individuals to guide decision-making.  Follow-up Plan for follow-up and lab work scheduling. - Schedule follow-up in six months with an option to cancel if doing well. - Plan for annual comprehensive physical exam in twelve months. - Order fasting blood work to be done at [facility].  Recording duration: 39 minutes     Harlene Horton, MD

## 2024-04-11 ENCOUNTER — Other Ambulatory Visit (INDEPENDENT_AMBULATORY_CARE_PROVIDER_SITE_OTHER)

## 2024-04-11 ENCOUNTER — Ambulatory Visit: Payer: Self-pay | Admitting: Family Medicine

## 2024-04-11 ENCOUNTER — Other Ambulatory Visit

## 2024-04-11 DIAGNOSIS — E782 Mixed hyperlipidemia: Secondary | ICD-10-CM | POA: Diagnosis not present

## 2024-04-11 DIAGNOSIS — I10 Essential (primary) hypertension: Secondary | ICD-10-CM

## 2024-04-11 DIAGNOSIS — R739 Hyperglycemia, unspecified: Secondary | ICD-10-CM | POA: Diagnosis not present

## 2024-04-11 LAB — COMPREHENSIVE METABOLIC PANEL WITH GFR
ALT: 10 U/L (ref 0–35)
AST: 15 U/L (ref 0–37)
Albumin: 4.2 g/dL (ref 3.5–5.2)
Alkaline Phosphatase: 72 U/L (ref 39–117)
BUN: 17 mg/dL (ref 6–23)
CO2: 24 meq/L (ref 19–32)
Calcium: 9.2 mg/dL (ref 8.4–10.5)
Chloride: 110 meq/L (ref 96–112)
Creatinine, Ser: 0.84 mg/dL (ref 0.40–1.20)
GFR: 72.1 mL/min
Glucose, Bld: 105 mg/dL — ABNORMAL HIGH (ref 70–99)
Potassium: 4.1 meq/L (ref 3.5–5.1)
Sodium: 144 meq/L (ref 135–145)
Total Bilirubin: 0.9 mg/dL (ref 0.2–1.2)
Total Protein: 7.4 g/dL (ref 6.0–8.3)

## 2024-04-11 LAB — LIPID PANEL
Cholesterol: 179 mg/dL (ref 0–200)
HDL: 73.3 mg/dL
LDL Cholesterol: 97 mg/dL (ref 0–99)
NonHDL: 105.72
Total CHOL/HDL Ratio: 2
Triglycerides: 43 mg/dL (ref 0.0–149.0)
VLDL: 8.6 mg/dL (ref 0.0–40.0)

## 2024-04-11 LAB — CBC WITH DIFFERENTIAL/PLATELET
Basophils Absolute: 0 K/uL (ref 0.0–0.1)
Basophils Relative: 1.1 % (ref 0.0–3.0)
Eosinophils Absolute: 0.2 K/uL (ref 0.0–0.7)
Eosinophils Relative: 5.7 % — ABNORMAL HIGH (ref 0.0–5.0)
HCT: 41.3 % (ref 36.0–46.0)
Hemoglobin: 13.4 g/dL (ref 12.0–15.0)
Lymphocytes Relative: 41.5 % (ref 12.0–46.0)
Lymphs Abs: 1.4 K/uL (ref 0.7–4.0)
MCHC: 32.5 g/dL (ref 30.0–36.0)
MCV: 85.3 fl (ref 78.0–100.0)
Monocytes Absolute: 0.4 K/uL (ref 0.1–1.0)
Monocytes Relative: 11.7 % (ref 3.0–12.0)
Neutro Abs: 1.3 K/uL — ABNORMAL LOW (ref 1.4–7.7)
Neutrophils Relative %: 40 % — ABNORMAL LOW (ref 43.0–77.0)
Platelets: 246 K/uL (ref 150.0–400.0)
RBC: 4.85 Mil/uL (ref 3.87–5.11)
RDW: 14.5 % (ref 11.5–15.5)
WBC: 3.4 K/uL — ABNORMAL LOW (ref 4.0–10.5)

## 2024-04-11 LAB — HEMOGLOBIN A1C: Hgb A1c MFr Bld: 6.2 % (ref 4.6–6.5)

## 2024-04-11 LAB — TSH: TSH: 2.48 u[IU]/mL (ref 0.35–5.50)

## 2024-04-13 ENCOUNTER — Ambulatory Visit: Payer: Self-pay | Admitting: Family Medicine

## 2024-04-13 DIAGNOSIS — R739 Hyperglycemia, unspecified: Secondary | ICD-10-CM

## 2024-05-06 ENCOUNTER — Other Ambulatory Visit: Payer: Self-pay | Admitting: Family Medicine

## 2024-05-09 ENCOUNTER — Other Ambulatory Visit: Payer: Self-pay | Admitting: Family Medicine

## 2024-05-22 ENCOUNTER — Encounter (HOSPITAL_BASED_OUTPATIENT_CLINIC_OR_DEPARTMENT_OTHER): Payer: Self-pay

## 2024-05-22 ENCOUNTER — Inpatient Hospital Stay (HOSPITAL_BASED_OUTPATIENT_CLINIC_OR_DEPARTMENT_OTHER)
Admission: RE | Admit: 2024-05-22 | Discharge: 2024-05-22 | Disposition: A | Source: Ambulatory Visit | Attending: Family Medicine | Admitting: Family Medicine

## 2024-05-22 ENCOUNTER — Ambulatory Visit (HOSPITAL_BASED_OUTPATIENT_CLINIC_OR_DEPARTMENT_OTHER)
Admission: RE | Admit: 2024-05-22 | Discharge: 2024-05-22 | Disposition: A | Source: Ambulatory Visit | Attending: Family Medicine | Admitting: Family Medicine

## 2024-05-22 ENCOUNTER — Ambulatory Visit: Payer: Self-pay

## 2024-05-22 DIAGNOSIS — E2839 Other primary ovarian failure: Secondary | ICD-10-CM | POA: Diagnosis present

## 2024-05-22 DIAGNOSIS — Z780 Asymptomatic menopausal state: Secondary | ICD-10-CM | POA: Diagnosis present

## 2024-05-22 DIAGNOSIS — Z1231 Encounter for screening mammogram for malignant neoplasm of breast: Secondary | ICD-10-CM | POA: Insufficient documentation

## 2024-05-22 NOTE — Telephone Encounter (Signed)
 Error CRM sent. KMS states to schedule Dr Domenica Pt's w/ [facility] first then other providers. No appt scheduled. I've asked E2C2 to try calling Pt again to schedule an appt.

## 2024-05-22 NOTE — Telephone Encounter (Signed)
 Error/Investigation Details: The  attached CRM was created after it was identified the patient did not get transferred to NT on the original call (CRM# [id number]) due to rib pain. NT has made 3 attempts to contact the patient today to triage and schedule, if appropriate. At this time, [facility] has already made multiple outreach attempts and we currently are waiting on the patient call back to receive triage and be scheduled. Specialist will be coached accordingly on checking KMS for scheduling instructions.

## 2024-05-22 NOTE — Telephone Encounter (Signed)
 3rd attempt. Mailbox full, unable to leave voicemail.

## 2024-05-22 NOTE — Telephone Encounter (Signed)
 2nd attempt. Mailbox full, unable to leave voice mail.

## 2024-05-22 NOTE — Telephone Encounter (Signed)
 Copied from CRM #[id number]. Topic: Clinical - Red Word Triage >> May 22, 2024  9:39 AM Corin V wrote: [facility] that prompted transfer to Nurse Triage: Red Word identified during call review. Patient called in to schedule appointment for rib pain and was not transferred to NT or scheduled due to her PCP not having availability for an office visit until March. Please reach out to patient to triage for possible acute appointment this week or other recommendations.

## 2024-09-22 ENCOUNTER — Ambulatory Visit: Admitting: Family Medicine

## 2024-09-23 ENCOUNTER — Ambulatory Visit: Admitting: Student

## 2025-03-26 ENCOUNTER — Encounter: Admitting: Student

## 2025-03-26 ENCOUNTER — Encounter: Admitting: Family Medicine
